# Patient Record
Sex: Male | Born: 1965
Health system: Southern US, Community
[De-identification: ages and names within clinical notes are randomized; demographics above are authoritative.]

## PROBLEM LIST (undated history)

## (undated) DIAGNOSIS — M75100 Unspecified rotator cuff tear or rupture of unspecified shoulder, not specified as traumatic: Secondary | ICD-10-CM

## (undated) DIAGNOSIS — F909 Attention-deficit hyperactivity disorder, unspecified type: Secondary | ICD-10-CM

## (undated) DIAGNOSIS — I10 Essential (primary) hypertension: Secondary | ICD-10-CM

## (undated) DIAGNOSIS — Z8249 Family history of ischemic heart disease and other diseases of the circulatory system: Secondary | ICD-10-CM

## (undated) DIAGNOSIS — E785 Hyperlipidemia, unspecified: Secondary | ICD-10-CM

## (undated) DIAGNOSIS — E8881 Metabolic syndrome: Secondary | ICD-10-CM

## (undated) DIAGNOSIS — I861 Scrotal varices: Secondary | ICD-10-CM

## (undated) HISTORY — DX: Hyperlipidemia, unspecified: E78.5

## (undated) HISTORY — DX: Scrotal varices: I86.1

## (undated) HISTORY — PX: APPENDECTOMY: SHX54

## (undated) HISTORY — PX: ORIF ANKLE FRACTURE: SUR919

## (undated) HISTORY — DX: Metabolic syndrome: E88.81

## (undated) HISTORY — DX: Unspecified rotator cuff tear or rupture of unspecified shoulder, not specified as traumatic: M75.100

## (undated) HISTORY — DX: Family history of ischemic heart disease and other diseases of the circulatory system: Z82.49

## (undated) HISTORY — PX: COLONOSCOPY: SHX174

## (undated) HISTORY — DX: Metabolic syndrome: E88.810

## (undated) HISTORY — DX: Attention-deficit hyperactivity disorder, unspecified type: F90.9

---

## 2004-07-13 ENCOUNTER — Ambulatory Visit (HOSPITAL_COMMUNITY): Admission: RE | Admit: 2004-07-13 | Discharge: 2004-07-13 | Payer: Self-pay | Admitting: *Deleted

## 2006-11-02 ENCOUNTER — Ambulatory Visit (HOSPITAL_COMMUNITY): Admission: RE | Admit: 2006-11-02 | Discharge: 2006-11-02 | Payer: Self-pay | Admitting: Urology

## 2014-06-06 ENCOUNTER — Encounter: Payer: Self-pay | Admitting: *Deleted

## 2014-06-07 ENCOUNTER — Encounter: Payer: Self-pay | Admitting: *Deleted

## 2016-11-24 DIAGNOSIS — M25512 Pain in left shoulder: Secondary | ICD-10-CM | POA: Diagnosis not present

## 2016-11-24 DIAGNOSIS — M25511 Pain in right shoulder: Secondary | ICD-10-CM | POA: Diagnosis not present

## 2016-11-24 DIAGNOSIS — M25559 Pain in unspecified hip: Secondary | ICD-10-CM | POA: Diagnosis not present

## 2016-12-13 DIAGNOSIS — M25512 Pain in left shoulder: Secondary | ICD-10-CM | POA: Diagnosis not present

## 2016-12-13 DIAGNOSIS — M25559 Pain in unspecified hip: Secondary | ICD-10-CM | POA: Diagnosis not present

## 2016-12-13 DIAGNOSIS — M25511 Pain in right shoulder: Secondary | ICD-10-CM | POA: Diagnosis not present

## 2017-02-14 DIAGNOSIS — Z23 Encounter for immunization: Secondary | ICD-10-CM | POA: Diagnosis not present

## 2017-02-14 DIAGNOSIS — E782 Mixed hyperlipidemia: Secondary | ICD-10-CM | POA: Diagnosis not present

## 2017-02-14 DIAGNOSIS — Z79899 Other long term (current) drug therapy: Secondary | ICD-10-CM | POA: Diagnosis not present

## 2017-02-14 DIAGNOSIS — G479 Sleep disorder, unspecified: Secondary | ICD-10-CM | POA: Diagnosis not present

## 2017-02-14 DIAGNOSIS — Z0000 Encounter for general adult medical examination without abnormal findings: Secondary | ICD-10-CM | POA: Diagnosis not present

## 2017-02-14 DIAGNOSIS — I10 Essential (primary) hypertension: Secondary | ICD-10-CM | POA: Diagnosis not present

## 2017-03-07 DIAGNOSIS — C44619 Basal cell carcinoma of skin of left upper limb, including shoulder: Secondary | ICD-10-CM | POA: Diagnosis not present

## 2017-03-07 DIAGNOSIS — L42 Pityriasis rosea: Secondary | ICD-10-CM | POA: Diagnosis not present

## 2017-03-07 DIAGNOSIS — D225 Melanocytic nevi of trunk: Secondary | ICD-10-CM | POA: Diagnosis not present

## 2017-03-07 DIAGNOSIS — C44519 Basal cell carcinoma of skin of other part of trunk: Secondary | ICD-10-CM | POA: Diagnosis not present

## 2017-03-07 DIAGNOSIS — L821 Other seborrheic keratosis: Secondary | ICD-10-CM | POA: Diagnosis not present

## 2017-04-25 DIAGNOSIS — K4090 Unilateral inguinal hernia, without obstruction or gangrene, not specified as recurrent: Secondary | ICD-10-CM | POA: Diagnosis not present

## 2017-06-06 DIAGNOSIS — K4090 Unilateral inguinal hernia, without obstruction or gangrene, not specified as recurrent: Secondary | ICD-10-CM | POA: Diagnosis not present

## 2017-08-18 DIAGNOSIS — I10 Essential (primary) hypertension: Secondary | ICD-10-CM | POA: Diagnosis not present

## 2017-08-18 DIAGNOSIS — K4090 Unilateral inguinal hernia, without obstruction or gangrene, not specified as recurrent: Secondary | ICD-10-CM | POA: Diagnosis not present

## 2017-09-02 DIAGNOSIS — J069 Acute upper respiratory infection, unspecified: Secondary | ICD-10-CM | POA: Diagnosis not present

## 2017-09-05 DIAGNOSIS — R05 Cough: Secondary | ICD-10-CM | POA: Diagnosis not present

## 2017-09-05 DIAGNOSIS — J0100 Acute maxillary sinusitis, unspecified: Secondary | ICD-10-CM | POA: Diagnosis not present

## 2017-12-18 DIAGNOSIS — M542 Cervicalgia: Secondary | ICD-10-CM | POA: Diagnosis not present

## 2017-12-18 DIAGNOSIS — M6281 Muscle weakness (generalized): Secondary | ICD-10-CM | POA: Diagnosis not present

## 2017-12-18 DIAGNOSIS — M79602 Pain in left arm: Secondary | ICD-10-CM | POA: Diagnosis not present

## 2018-01-03 DIAGNOSIS — M79602 Pain in left arm: Secondary | ICD-10-CM | POA: Diagnosis not present

## 2018-01-03 DIAGNOSIS — M542 Cervicalgia: Secondary | ICD-10-CM | POA: Diagnosis not present

## 2018-01-03 DIAGNOSIS — M6281 Muscle weakness (generalized): Secondary | ICD-10-CM | POA: Diagnosis not present

## 2018-11-12 ENCOUNTER — Ambulatory Visit (INDEPENDENT_AMBULATORY_CARE_PROVIDER_SITE_OTHER): Payer: 59 | Admitting: Family Medicine

## 2018-11-12 ENCOUNTER — Encounter (INDEPENDENT_AMBULATORY_CARE_PROVIDER_SITE_OTHER): Payer: Self-pay | Admitting: Family Medicine

## 2018-11-12 ENCOUNTER — Ambulatory Visit (INDEPENDENT_AMBULATORY_CARE_PROVIDER_SITE_OTHER): Payer: 59

## 2018-11-12 VITALS — BP 132/81 | HR 72 | Ht 72.0 in | Wt 185.0 lb

## 2018-11-12 DIAGNOSIS — M25552 Pain in left hip: Secondary | ICD-10-CM | POA: Diagnosis not present

## 2018-11-12 NOTE — Progress Notes (Signed)
   Office Visit Note   Patient: Fernando Hernandez           Date of Birth: [birth year]           MRN: [id number] Visit Date: 11/12/2018 Requested by: No referring provider defined for this encounter. PCP: Josetta Huddle, MD  Subjective: Chief Complaint  Patient presents with  . Left Hip - Pain  . Hip Pain    Lt hip---sharp, constant sore--2 months--Pt went to Physical therapy for 1 month.    HPI: He is a 53 year old seen at the request of [place] banker for left hip pain.  Symptoms started a couple months ago, no injury.  He is active exercising at the gym and jogging.  There has been no significant change in his routine.  He started noticing intermittent groin pain with occasional activities.  He cannot completely reproduce the pain reliably.  Occasional popping.  He recalls when he was younger a "groin pull" injury from which he recovered without any long-term troubles.  He has not taken medication for this pain because it is not constant.  He is working with his physical therapist but not making much progress yet.               ROS: Otherwise in excellent health.  Other systems were negative.  Objective: Vital Signs: BP 132/81   Pulse 72   Ht 6' (1.829 m)   Wt 185 lb (83.9 kg)   BMI 25.09 kg/m   Physical Exam:  Left hip: No tenderness over the greater trochanter.  He has very limited range of motion of both hips with internal rotation.  He has pain on the left with passive flexion and internal rotation and also at the extreme of external rotation.  No significant pain with isometric strength testing.  Leg lengths are equal.  Imaging: X-rays left hip: He has slight flattening of the femoral head bilaterally.  More so on the left.  There is calcification at the lateral aspect of the acetabulum suggesting chronic labrum tear.  The acetabulum itself is unremarkable, there is still AC joint space.  Assessment & Plan: 1.  Left hip pain, suspicious for AVN.  He probably has it on the right as  well. -MRI to evaluate.  Probably modify activities depending on the findings.   Follow-Up Instructions: No follow-ups on file.      Procedures: No procedures performed  No notes on file    PMFS History: There are no active problems to display for this patient.  Past Medical History:  Diagnosis Date  . Attention deficit hyperactivity disorder (ADHD)   . Family history of early CAD   . Hyperlipidemia   . Left varicocele   . Metabolic syndrome   . Rotator cuff syndrome     Family History  Family history unknown: Yes    History reviewed. No pertinent surgical history. Social History   Occupational History  . Not on file  Tobacco Use  . Smoking status: Never Smoker  . Smokeless tobacco: Never Used  Substance and Sexual Activity  . Alcohol use: Yes    Comment: occasion  . Drug use: No  . Sexual activity: Not on file

## 2018-11-12 NOTE — Patient Instructions (Signed)
   Possible avascular necrosis of femoral head (AVN)

## 2018-11-18 ENCOUNTER — Ambulatory Visit
Admission: RE | Admit: 2018-11-18 | Discharge: 2018-11-18 | Disposition: A | Discharge status: Home / Self Care | Payer: 59 | Source: Ambulatory Visit | Attending: Family Medicine | Admitting: Family Medicine

## 2018-11-18 DIAGNOSIS — M25552 Pain in left hip: Secondary | ICD-10-CM

## 2018-11-19 ENCOUNTER — Telehealth (INDEPENDENT_AMBULATORY_CARE_PROVIDER_SITE_OTHER): Payer: Self-pay | Admitting: Family Medicine

## 2018-11-19 ENCOUNTER — Telehealth (INDEPENDENT_AMBULATORY_CARE_PROVIDER_SITE_OTHER): Payer: Self-pay

## 2018-11-19 NOTE — Telephone Encounter (Signed)
I called and advised the patient of the results and plan. He voiced understanding.

## 2018-11-19 NOTE — Telephone Encounter (Signed)
Pelvis MRI scan shows a substantial amount of arthritis in both hips, but fortunately no sign of avascular necrosis.  He will modify activities to prolong life of hips.

## 2018-11-19 NOTE — Telephone Encounter (Signed)
      Fernando Hernandez Male, 53 y.o., 11/30/1965 MRN:  [id number] Phone:  [phone] (H) Needs Interpreter: Cleophus Molt PCP:  Josetta Huddle, MD Primary [facility]vg:  [facility] OTHER MRI results  Received: Today  Message Contents  [place], Legrand Como, MD  Marlyne Beards, CMA        I called, but got voice mail. Email address didn't work.   Please tell him:   Pelvis MRI scan shows a substantial amount of arthritis in both hips, but fortunately no sign of avascular necrosis. He should modify exercise activities to prolong life of hips. Would recommend switching from running to something involving less pounding such as bike, elliptical trainer, swimming, etc. I would also avoid doing heavy squats and lunges at the gym. If pain gets worse, could inject with cortisone. Otherwise follow-up as needed. No surgery indicated for now.

## 2018-12-31 ENCOUNTER — Telehealth (INDEPENDENT_AMBULATORY_CARE_PROVIDER_SITE_OTHER): Payer: Self-pay | Admitting: Family Medicine

## 2018-12-31 NOTE — Telephone Encounter (Signed)
Patient called wanting to be sure his ov note from his last ov was sent to his PCP, Dr. Mertha Finders. I confirmed with patient it was sent

## 2019-01-03 ENCOUNTER — Ambulatory Visit
Admission: RE | Admit: 2019-01-03 | Discharge: 2019-01-03 | Disposition: A | Discharge status: Home / Self Care | Payer: 59 | Source: Ambulatory Visit | Attending: Internal Medicine | Admitting: Internal Medicine

## 2019-01-03 ENCOUNTER — Other Ambulatory Visit: Payer: Self-pay

## 2019-01-03 ENCOUNTER — Other Ambulatory Visit: Payer: Self-pay | Admitting: Internal Medicine

## 2019-01-03 DIAGNOSIS — M545 Low back pain, unspecified: Secondary | ICD-10-CM

## 2019-01-14 ENCOUNTER — Other Ambulatory Visit: Payer: Self-pay | Admitting: Internal Medicine

## 2019-01-14 DIAGNOSIS — M5416 Radiculopathy, lumbar region: Secondary | ICD-10-CM

## 2019-01-14 DIAGNOSIS — M545 Low back pain, unspecified: Secondary | ICD-10-CM

## 2019-01-17 ENCOUNTER — Other Ambulatory Visit: Payer: 59

## 2019-02-18 ENCOUNTER — Ambulatory Visit
Admission: RE | Admit: 2019-02-18 | Discharge: 2019-02-18 | Disposition: A | Discharge status: Home / Self Care | Payer: 59 | Source: Ambulatory Visit | Attending: Internal Medicine | Admitting: Internal Medicine

## 2019-02-18 ENCOUNTER — Other Ambulatory Visit: Payer: Self-pay

## 2019-02-18 DIAGNOSIS — M545 Low back pain, unspecified: Secondary | ICD-10-CM

## 2019-02-18 DIAGNOSIS — M5416 Radiculopathy, lumbar region: Secondary | ICD-10-CM

## 2019-07-23 ENCOUNTER — Other Ambulatory Visit: Payer: Self-pay

## 2019-07-23 DIAGNOSIS — Z20822 Contact with and (suspected) exposure to covid-19: Secondary | ICD-10-CM

## 2019-07-24 LAB — NOVEL CORONAVIRUS, NAA: SARS-CoV-2, NAA: NOT DETECTED

## 2019-07-27 IMAGING — CR LUMBAR SPINE - 2-3 VIEW
3 series · 3 of 3 positions shown · non-contrast
Comparison: None.

CLINICAL DATA: 52-year-old male with a history of left groin and
buttock pain for 6 months

EXAM:
LUMBAR SPINE - 2-3 VIEW

[w lumbar spine ap]
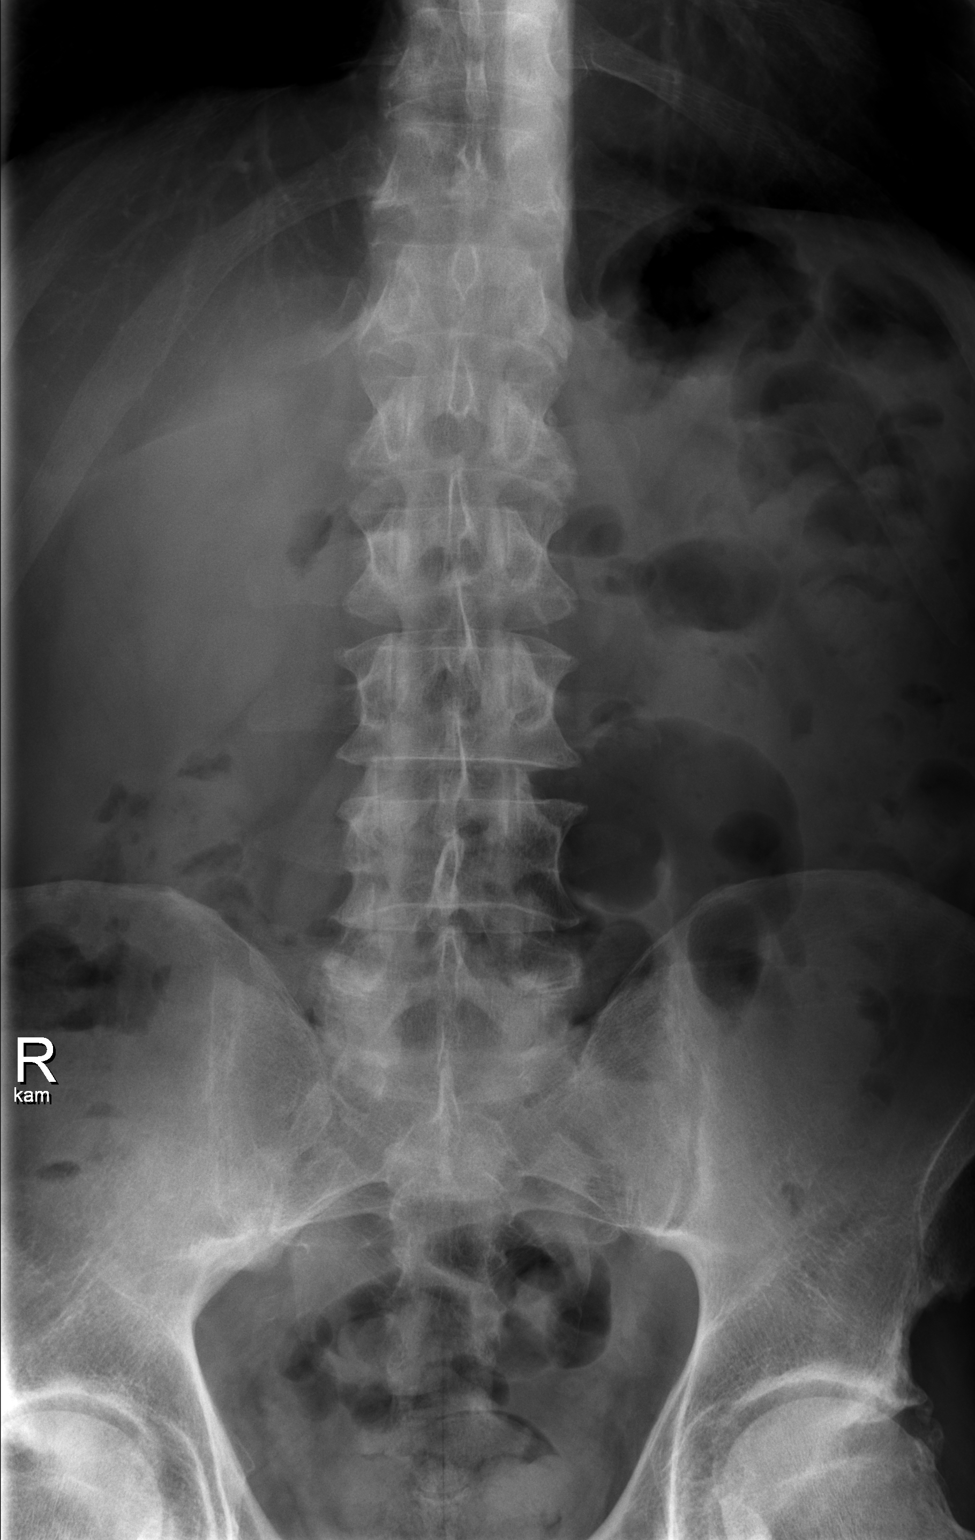

[w lumbar spine lat]
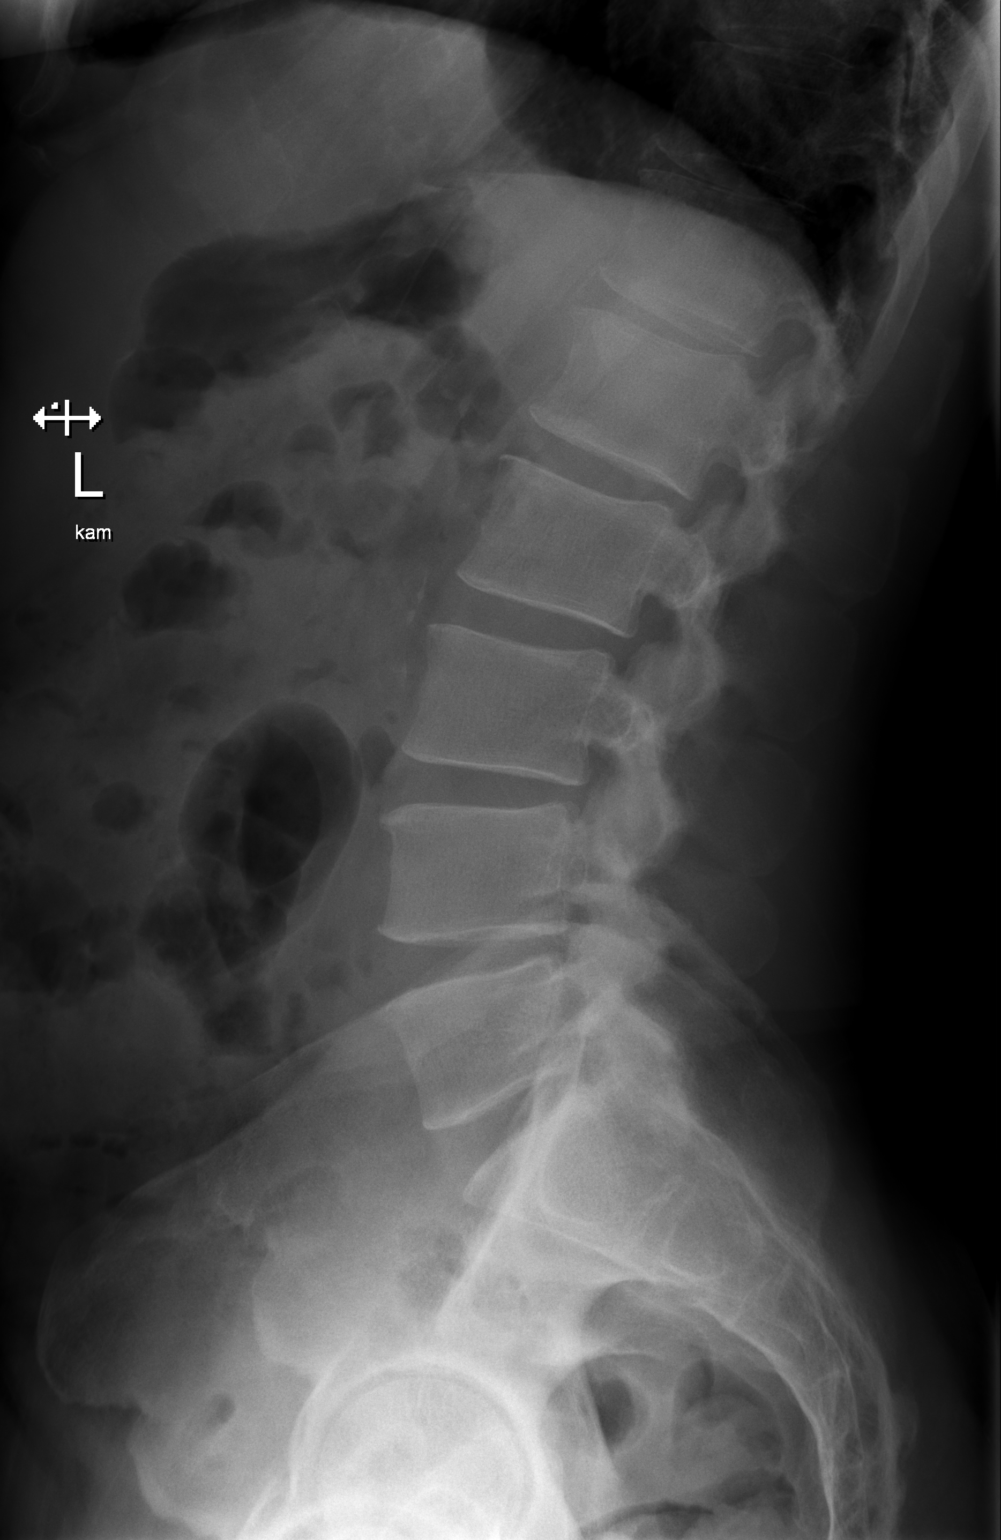

[w lumbar l-5 s-1 spot]
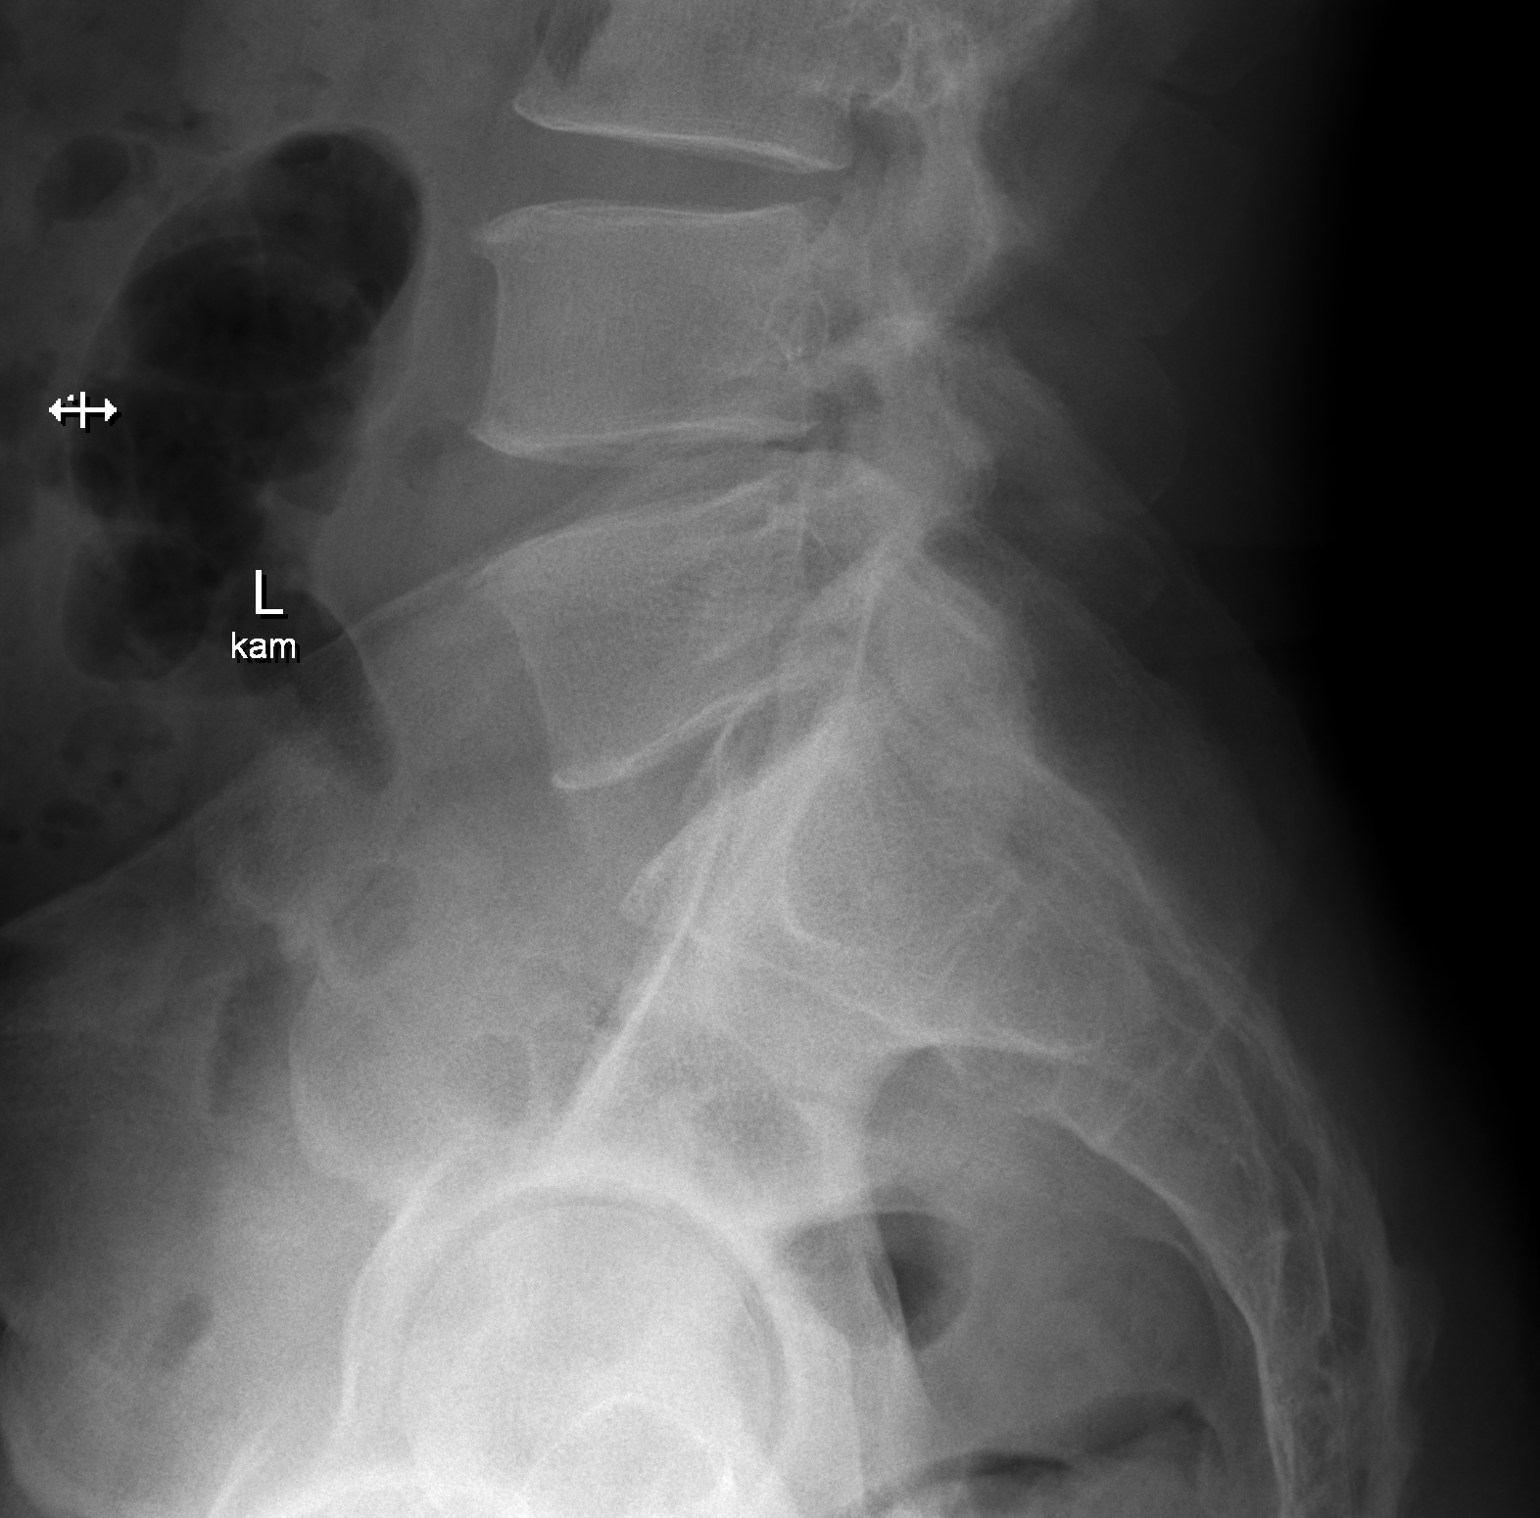

[3 of 3 positions shown; findings below may reference images not displayed]

FINDINGS: Lumbar Spine:

Lumbar vertebral elements maintain normal alignment without evidence
of anterolisthesis, retrolisthesis, subluxation.

No acute fracture line identified.

Vertebral body heights maintained.

Relatively maintained disc spaces with early endplate changes and
early anterior osteophyte production at the L3-L4 and L4-L5 levels.

Mild facet disease of L3-L4, L4-L5, L5-S1.

Unremarkable appearance of the visualized abdomen.
IMPRESSION: Negative for acute fracture or malalignment of the lumbar spine.

Mild disc disease and facet disease.

## 2019-08-01 ENCOUNTER — Other Ambulatory Visit: Payer: Self-pay

## 2019-08-01 DIAGNOSIS — Z20822 Contact with and (suspected) exposure to covid-19: Secondary | ICD-10-CM

## 2019-08-02 LAB — NOVEL CORONAVIRUS, NAA: SARS-CoV-2, NAA: NOT DETECTED

## 2019-09-24 ENCOUNTER — Ambulatory Visit: Payer: 59 | Attending: Internal Medicine

## 2019-09-24 DIAGNOSIS — Z20822 Contact with and (suspected) exposure to covid-19: Secondary | ICD-10-CM

## 2019-09-25 LAB — NOVEL CORONAVIRUS, NAA: SARS-CoV-2, NAA: NOT DETECTED

## 2019-11-13 ENCOUNTER — Other Ambulatory Visit: Payer: 59

## 2019-11-13 ENCOUNTER — Ambulatory Visit: Payer: 59 | Attending: Internal Medicine

## 2019-11-13 DIAGNOSIS — Z20822 Contact with and (suspected) exposure to covid-19: Secondary | ICD-10-CM

## 2019-11-14 LAB — NOVEL CORONAVIRUS, NAA: SARS-CoV-2, NAA: NOT DETECTED

## 2020-01-13 ENCOUNTER — Other Ambulatory Visit: Payer: 59

## 2020-01-15 ENCOUNTER — Ambulatory Visit: Payer: 59 | Attending: Internal Medicine

## 2020-01-15 DIAGNOSIS — Z20822 Contact with and (suspected) exposure to covid-19: Secondary | ICD-10-CM

## 2020-01-16 LAB — SARS-COV-2, NAA 2 DAY TAT

## 2020-01-16 LAB — NOVEL CORONAVIRUS, NAA: SARS-CoV-2, NAA: NOT DETECTED

## 2021-02-09 DIAGNOSIS — F39 Unspecified mood [affective] disorder: Secondary | ICD-10-CM | POA: Diagnosis not present

## 2021-02-09 DIAGNOSIS — R5383 Other fatigue: Secondary | ICD-10-CM | POA: Diagnosis not present

## 2021-06-21 DIAGNOSIS — Z79899 Other long term (current) drug therapy: Secondary | ICD-10-CM | POA: Diagnosis not present

## 2021-06-21 DIAGNOSIS — Z0001 Encounter for general adult medical examination with abnormal findings: Secondary | ICD-10-CM | POA: Diagnosis not present

## 2021-06-21 DIAGNOSIS — E782 Mixed hyperlipidemia: Secondary | ICD-10-CM | POA: Diagnosis not present

## 2021-06-21 DIAGNOSIS — Z125 Encounter for screening for malignant neoplasm of prostate: Secondary | ICD-10-CM | POA: Diagnosis not present

## 2021-06-21 DIAGNOSIS — I10 Essential (primary) hypertension: Secondary | ICD-10-CM | POA: Diagnosis not present

## 2021-06-21 DIAGNOSIS — G479 Sleep disorder, unspecified: Secondary | ICD-10-CM | POA: Diagnosis not present

## 2021-06-21 DIAGNOSIS — F909 Attention-deficit hyperactivity disorder, unspecified type: Secondary | ICD-10-CM | POA: Diagnosis not present

## 2021-07-21 DIAGNOSIS — I10 Essential (primary) hypertension: Secondary | ICD-10-CM | POA: Diagnosis not present

## 2021-08-16 DIAGNOSIS — H0288A Meibomian gland dysfunction right eye, upper and lower eyelids: Secondary | ICD-10-CM | POA: Diagnosis not present

## 2021-08-16 DIAGNOSIS — H0288B Meibomian gland dysfunction left eye, upper and lower eyelids: Secondary | ICD-10-CM | POA: Diagnosis not present

## 2021-08-16 DIAGNOSIS — H16223 Keratoconjunctivitis sicca, not specified as Sjogren's, bilateral: Secondary | ICD-10-CM | POA: Diagnosis not present

## 2022-05-19 ENCOUNTER — Other Ambulatory Visit: Payer: Self-pay | Admitting: Internal Medicine

## 2022-05-19 DIAGNOSIS — I10 Essential (primary) hypertension: Secondary | ICD-10-CM

## 2022-05-19 DIAGNOSIS — E782 Mixed hyperlipidemia: Secondary | ICD-10-CM

## 2022-05-19 DIAGNOSIS — Z8249 Family history of ischemic heart disease and other diseases of the circulatory system: Secondary | ICD-10-CM

## 2022-07-13 ENCOUNTER — Other Ambulatory Visit: Payer: Self-pay | Admitting: Internal Medicine

## 2022-07-13 DIAGNOSIS — S8292XB Unspecified fracture of left lower leg, initial encounter for open fracture type I or II: Secondary | ICD-10-CM

## 2022-07-13 DIAGNOSIS — S82892B Other fracture of left lower leg, initial encounter for open fracture type I or II: Secondary | ICD-10-CM

## 2022-07-13 DIAGNOSIS — T07XXXA Unspecified multiple injuries, initial encounter: Secondary | ICD-10-CM

## 2022-07-29 ENCOUNTER — Other Ambulatory Visit: Payer: 59

## 2022-08-10 ENCOUNTER — Ambulatory Visit
Admission: RE | Admit: 2022-08-10 | Discharge: 2022-08-10 | Disposition: A | Discharge status: Home / Self Care | Payer: No Typology Code available for payment source | Source: Ambulatory Visit | Attending: Internal Medicine | Admitting: Internal Medicine

## 2022-08-10 DIAGNOSIS — Z8249 Family history of ischemic heart disease and other diseases of the circulatory system: Secondary | ICD-10-CM

## 2022-08-10 DIAGNOSIS — E782 Mixed hyperlipidemia: Secondary | ICD-10-CM

## 2022-08-10 DIAGNOSIS — I10 Essential (primary) hypertension: Secondary | ICD-10-CM

## 2022-08-16 ENCOUNTER — Ambulatory Visit
Admission: RE | Admit: 2022-08-16 | Discharge: 2022-08-16 | Disposition: A | Discharge status: Home / Self Care | Payer: No Typology Code available for payment source | Source: Ambulatory Visit | Attending: Internal Medicine | Admitting: Internal Medicine

## 2022-08-16 DIAGNOSIS — S8292XB Unspecified fracture of left lower leg, initial encounter for open fracture type I or II: Secondary | ICD-10-CM

## 2022-08-16 DIAGNOSIS — S82892B Other fracture of left lower leg, initial encounter for open fracture type I or II: Secondary | ICD-10-CM

## 2022-08-16 DIAGNOSIS — T07XXXA Unspecified multiple injuries, initial encounter: Secondary | ICD-10-CM

## 2023-12-19 ENCOUNTER — Ambulatory Visit (INDEPENDENT_AMBULATORY_CARE_PROVIDER_SITE_OTHER): Admitting: Internal Medicine

## 2023-12-19 ENCOUNTER — Encounter (HOSPITAL_BASED_OUTPATIENT_CLINIC_OR_DEPARTMENT_OTHER): Payer: Self-pay | Admitting: Internal Medicine

## 2023-12-19 VITALS — BP 124/62 | HR 83 | Ht 72.0 in | Wt 186.5 lb

## 2023-12-19 DIAGNOSIS — E7841 Elevated Lipoprotein(a): Secondary | ICD-10-CM | POA: Diagnosis not present

## 2023-12-19 DIAGNOSIS — I2510 Atherosclerotic heart disease of native coronary artery without angina pectoris: Secondary | ICD-10-CM | POA: Diagnosis not present

## 2023-12-19 DIAGNOSIS — E785 Hyperlipidemia, unspecified: Secondary | ICD-10-CM | POA: Diagnosis not present

## 2023-12-19 NOTE — Patient Instructions (Signed)
 Medication Instructions:  Dr. Rennis Golden recommends Repatha Sureclick 140mg /mL (PCSK9). This is an injectable cholesterol medication self-administered once every 14 days. This medication will likely need prior approval with your insurance company, which we will work on. If the medication is not approved initially, we may need to do an appeal with your insurance.   Administer medication in area of fatty tissue such as abdomen, outer thigh, back of upper arm - and rotate site with each injection Store medication in refrigerator until ready to administer - allow to sit at room temp for 30 mins - 1 hour prior to injection Dispose of medication in a SHARPS container - your pharmacy should be able to direct you on this and proper disposal   If you need a co-pay card for Repatha: Lawsponsor.fr If you need a co-pay card for Praluent: [email or web address]://[email or web address]  Patient Assistance:    These foundations have funds at various times.   The PAN Foundation: https://www.panfoundation.org/disease-funds/hypercholesterolemia/ -- can sign up for wait list  The [facility] offers assistance to help pay for medication copays.  They will cover copays for all cholesterol lowering meds, including statins, fibrates, omega-3 fish oils like Vascepa, ezetimibe, Repatha, Praluent, Nexletol, Nexlizet.  The cards are usually good for $2,500 or 12 months, whichever comes first. Our fax # is [phone] (you will need this to apply) Go to healthwellfoundation.org Click on "Apply Now" Answer questions as to whom is applying (patient or representative) Your disease fund will be "hypercholesterolemia - Medicare access" They will ask questions about finances and which medications you are taking for cholesterol When you submit, the approval is usually within minutes.  You will need to print the card information from the site You will need to show this information to your pharmacy,  they will bill your Medicare Part D plan first -then bill Health Well --for the copay.   You can also call them at [phone], although the hold times can be quite long.    *If you need a refill on your cardiac medications before your next appointment, please call your pharmacy*   Lab Work: FASTING lab work in 3-4 months  ** complete about one week before next appointment   If you have labs (blood work) drawn today and your tests are completely normal, you will receive your results only by: MyChart Message (if you have MyChart) OR A paper copy in the mail If you have any lab test that is abnormal or we need to change your treatment, we will call you to review the results.   Follow-Up: At [facility], you and your health needs are our priority.  As part of our continuing mission to provide you with exceptional heart care, we have created designated Provider Care Teams.  These Care Teams include your primary Cardiologist (physician) and Advanced Practice Providers (APPs -  Physician Assistants and Nurse Practitioners) who all work together to provide you with the care you need, when you need it.  We recommend signing up for the patient portal called "MyChart".  Sign up information is provided on this After Visit Summary.  MyChart is used to connect with patients for Virtual Visits (Telemedicine).  Patients are able to view lab/test results, encounter notes, upcoming appointments, etc.  Non-urgent messages can be sent to your provider as well.   To learn more about what you can do with MyChart, go to [email or web address].    Your next appointment:   3-4 months with Eligha Bridegroom, NP -- lipid clinic

## 2023-12-19 NOTE — Progress Notes (Signed)
 LIPID CLINIC CONSULT NOTE  Chief Complaint:  Manage dyslipidemia  Primary Care Physician: Marden Noble, MD (Inactive)  Primary Cardiologist:  None  HPI:  Fernando Hernandez is a 58 y.o. male who is being seen today for the evaluation of dyslipidemia at the request of Emilio Aspen, *.  This is a pleasant 58 year old male kindly referred for evaluation management of dyslipidemia.  He has a history of coronary artery calcification seen by a calcium score in November 2023 of 91.8, 80th percentile for age and sex matched controls.  He also had assessment of LP(a) which is mildly elevated at 109.8 nmol/L.  His recent lipid profile showed total cholesterol 188, triglycerides 96, HDL 83 and LDL 88.  He is on rosuvastatin 40 mg daily.  He had a remote check of high-sensitivity CRP which was 2.0 in 2022.  The circumstances of that are not clear.  His father had an MI in his 31s and ultimately had CABG.  His goal LDL cholesterol should be below 70 if not lower.  To that end we discussed possible additional therapies today.  He says he is vigilant about his diet as far as reducing saturated fats and getting regular exercise.  PMHx:  Past Medical History:  Diagnosis Date   Attention deficit hyperactivity disorder (ADHD)    Family history of early CAD    Hyperlipidemia    Left varicocele    Metabolic syndrome    Rotator cuff syndrome     No past surgical history on file.  FAMHx:  Family History  Problem Relation Age of Onset   Heart attack Father    Coronary artery disease Father     SOCHx:   reports that he has never smoked. He has never used smokeless tobacco. He reports current alcohol use. He reports that he does not use drugs.  ALLERGIES:  No Known Allergies  ROS: Pertinent items noted in HPI and remainder of comprehensive ROS otherwise negative.  HOME MEDS: Current Outpatient Medications on File Prior to Visit  Medication Sig Dispense Refill   amLODipine  (NORVASC) 10 MG tablet Take 10 mg by mouth daily.     amphetamine-dextroamphetamine (ADDERALL) 15 MG tablet Take 15 mg by mouth daily.     aspirin 81 MG tablet Take 81 mg by mouth daily.     hydrochlorothiazide (HYDRODIURIL) 12.5 MG tablet Take 12.5 mg by mouth daily.     losartan (COZAAR) 100 MG tablet Take 100 mg by mouth daily. Takes 1 tab in the morning, 1/2 tab at night.     metoprolol succinate (TOPROL-XL) 25 MG 24 hr tablet Take 25 mg by mouth daily.     rosuvastatin (CRESTOR) 40 MG tablet Take 40 mg by mouth daily.     zolpidem (AMBIEN) 10 MG tablet Take 10 mg by mouth at bedtime as needed for sleep.     losartan-hydrochlorothiazide (HYZAAR) 50-12.5 MG tablet  (Patient not taking: Reported on 12/19/2023)     No current facility-administered medications on file prior to visit.    LABS/IMAGING: No results found for this or any previous visit (from the past 48 hours). No results found.  LIPID PANEL: No results found for: "CHOL", "TRIG", "HDL", "CHOLHDL", "VLDL", "LDLCALC", "LDLDIRECT"  WEIGHTS: Wt Readings from Last 3 Encounters:  12/19/23 186 lb 8 oz (84.6 kg)  11/12/18 185 lb (83.9 kg)    VITALS: BP 124/62   Pulse 83   Ht 6' (1.829 m)   Wt 186 lb 8 oz (84.6  kg)   SpO2 95%   BMI 25.29 kg/m   EXAM: Deferred  EKG: Deferred  ASSESSMENT: Dyslipidemia, goal LDL less than 70 Elevated AO(Z)-308.6 nmol/L CAC score 91.8, 90th percentile for age and sex matched controls (08/2022) History of HS-CRP of 2.0 (2022) Family history of premature coronary disease with father who had an MI and CABG  PLAN: 1.   Mr. Levora Angel has a mixed dyslipidemia with cholesterol that is above target.  He also has an elevated LP(a) and a high calcium score.  He needs more aggressive lipid-lowering and would probably benefit from a PCSK9 inhibitor which could help him reach his LDL target as well as improve LP(a) for most patients up to 20 to 30%.  Based on that I would not pursue PCSK9 inhibitor  therapy.  Will reach out for prior authorization.  Plan repeat lipids in about 3 to 4 months including a repeat LP(a) and follow-up afterwards.  Chrystie Nose, MD, [facility], FACP    [facility]  Medical Director of the Advanced Lipid Disorders &  Cardiovascular Risk Reduction Clinic Diplomate of the American Board of Clinical Lipidology Attending Cardiologist  Direct Dial: [phone]  Fax: [phone]  Website:  [email or web address] 12/19/2023, 5:10 PM

## 2023-12-21 ENCOUNTER — Other Ambulatory Visit (HOSPITAL_COMMUNITY): Payer: Self-pay

## 2023-12-21 ENCOUNTER — Telehealth: Payer: Self-pay | Admitting: Pharmacy Technician

## 2023-12-21 NOTE — Telephone Encounter (Signed)
 Ran test claim for repatha. For a 28 day supply and the co-pay is 40.00 . PA is not needed at this time. This test claim was processed through [facility]- copay amounts may vary at other pharmacies due to pharmacy/plan contracts, or as the patient moves through the different stages of their insurance plan.

## 2023-12-25 MED ORDER — REPATHA SURECLICK 140 MG/ML ~~LOC~~ SOAJ: 140.0000 mg | SUBCUTANEOUS | 3 refills | Status: DC

## 2023-12-25 NOTE — Addendum Note (Signed)
 Addended by: Lindell Spar on: 12/25/2023 03:02 PM   Modules accepted: Orders

## 2023-12-25 NOTE — Telephone Encounter (Signed)
 Update sent to patient via MyChart

## 2023-12-25 NOTE — Telephone Encounter (Signed)
 Rx(s) sent to pharmacy electronically.

## 2024-01-13 ENCOUNTER — Other Ambulatory Visit: Payer: Self-pay | Admitting: Medical Genetics

## 2024-01-24 ENCOUNTER — Other Ambulatory Visit

## 2024-01-24 DIAGNOSIS — Z006 Encounter for examination for normal comparison and control in clinical research program: Secondary | ICD-10-CM

## 2024-01-30 LAB — GENECONNECT MOLECULAR SCREEN: Genetic Analysis Overall Interpretation: NEGATIVE

## 2024-03-01 ENCOUNTER — Other Ambulatory Visit (HOSPITAL_COMMUNITY): Payer: Self-pay | Admitting: Internal Medicine

## 2024-03-06 ENCOUNTER — Ambulatory Visit (HOSPITAL_BASED_OUTPATIENT_CLINIC_OR_DEPARTMENT_OTHER): Admitting: Nurse Practitioner

## 2024-03-06 ENCOUNTER — Encounter (HOSPITAL_BASED_OUTPATIENT_CLINIC_OR_DEPARTMENT_OTHER): Payer: Self-pay | Admitting: Nurse Practitioner

## 2024-03-06 VITALS — BP 122/82 | HR 67 | Ht 72.0 in | Wt 185.0 lb

## 2024-03-06 DIAGNOSIS — E7841 Elevated Lipoprotein(a): Secondary | ICD-10-CM

## 2024-03-06 DIAGNOSIS — E785 Hyperlipidemia, unspecified: Secondary | ICD-10-CM | POA: Diagnosis not present

## 2024-03-06 DIAGNOSIS — R9431 Abnormal electrocardiogram [ECG] [EKG]: Secondary | ICD-10-CM

## 2024-03-06 DIAGNOSIS — I2510 Atherosclerotic heart disease of native coronary artery without angina pectoris: Secondary | ICD-10-CM | POA: Diagnosis not present

## 2024-03-06 DIAGNOSIS — I10 Essential (primary) hypertension: Secondary | ICD-10-CM

## 2024-03-06 MED ORDER — REPATHA SURECLICK 140 MG/ML ~~LOC~~ SOAJ: 140.0000 mg | SUBCUTANEOUS | 3 refills | Status: AC

## 2024-03-06 NOTE — Patient Instructions (Signed)
 Medication Instructions:   Your physician recommends that you continue on your current medications as directed. Please refer to the Current Medication list given to you today.   *If you need a refill on your cardiac medications before your next appointment, please call your pharmacy*  Lab Work:  None ordered.  If you have labs (blood work) drawn today and your tests are completely normal, you will receive your results only by: MyChart Message (if you have MyChart) OR A paper copy in the mail If you have any lab test that is abnormal or we need to change your treatment, we will call you to review the results.  Testing/Procedures:  Your physician has requested that you have an echocardiogram. Echocardiography is a painless test that uses sound waves to create images of your heart. It provides your doctor with information about the size and shape of your heart and how well your heart's chambers and valves are working. This procedure takes approximately one hour. There are no restrictions for this procedure. Please do NOT wear cologne, aftershave, or lotions (deodorant is allowed). Please arrive 15 minutes prior to your appointment time.  Follow-Up: At [facility], you and your health needs are our priority.  As part of our continuing mission to provide you with exceptional heart care, our providers are all part of one team.  This team includes your primary Cardiologist (physician) and Advanced Practice Providers or APPs (Physician Assistants and Nurse Practitioners) who all work together to provide you with the care you need, when you need it.  Your next appointment:   1 year(s)  Provider:   K. Italy Hilty, MD or Slater Duncan, NP    We recommend signing up for the patient portal called "MyChart".  Sign up information is provided on this After Visit Summary.  MyChart is used to connect with patients for Virtual Visits (Telemedicine).  Patients are able to view lab/test  results, encounter notes, upcoming appointments, etc.  Non-urgent messages can be sent to your provider as well.   To learn more about what you can do with MyChart, go to [email or web address].   Other Instructions  Your physician wants you to follow-up in: 1 year.  You will receive a reminder letter in the mail two months in advance. If you don't receive a letter, please call our office to schedule the follow-up appointment.

## 2024-03-06 NOTE — Progress Notes (Signed)
 Cardiology Office Note   Date:  03/06/2024  ID:  Fernando Hernandez, DOB 1965/11/29, MRN [id number] PCP: Berta Brittle, MD (Inactive)  [facility] Providers Cardiologist:  None     PMH Dyslipidemia Family history early CAD Coronary artery disease CT Calcium score 91.8 (80th percentile) Elevated LP(a) - 109.8 nmol/L  Referred to advanced lipid disorder clinic and seen by Dr. Maximo Spar 12/19/2023.  He has a history of coronary artery calcification seen on calcium score November 2023 of 91.8, 80th percentile for age/sex matched controls.  LP(a) is mildly elevated at 109.8 nmol/L.  Recent lipid panel showed total cholesterol 188, triglycerides 96, HDL 83, and LDL 88.  He is on rosuvastatin 40 mg daily.  Remote check of high-sensitivity CRP 2.0 in 2022.  The circumstances of that are not clear.  His father had MI in his 24s and ultimately had CABG.  He is vigilant about diet as far as reducing saturated fat and getting regular exercise.  He was advised to start Repatha  140 mg every 14 days.  History of Present Illness History of Present Illness Fernando Hernandez is a very pleasant 58 year old male who is here today for follow-up of dyslipidemia. He reports he is feeling well and remains active with regular work outs 4-5 times per week which involves running, weight lifting and various other HIIT type exercises. Despite regular exercise, he experiences shortness of breath when walking upstairs, but not with sprints or heavy lifting. He denies chest discomfort, orthopnea, PND, edema, presyncope, syncope or palpitations. He is concerned about his cardiovascular health due to a family history of heart disease. He is on Repatha , Crestor 40 mg, and a daily baby aspirin. His C-reactive protein is 1.21, and lipoprotein A levels remain unchanged. Blood pressure has been better controlled since being on multiple anti-hypertensive agents. His diet lacks fruits and vegetables but he generally eats healthy  food. He avoids fast food and red meat and generally brings salads to work. Occasional Etoh and sweets.   ROS: See HPI  Studies Reviewed EKG Interpretation Date/Time:  Wednesday March 06 2024 10:58:10 EDT Ventricular Rate:  67 PR Interval:  200 QRS Duration:  92 QT Interval:  384 QTC Calculation: 405 R Axis:   69  Text Interpretation: Normal sinus rhythm Minimal voltage criteria for LVH, may be normal variant ( Sokolow-Lyon ) No previous ECGs available Confirmed by Slater Duncan [phone]) on 03/06/2024 11:13:21 AM    No results found for: "LIPOA"  Risk Assessment/Calculations           Physical Exam VS:  BP 122/82   Pulse 67   Ht 6' (1.829 m)   Wt 185 lb (83.9 kg)   SpO2 98%   BMI 25.09 kg/m    Wt Readings from Last 3 Encounters:  03/06/24 185 lb (83.9 kg)  12/19/23 186 lb 8 oz (84.6 kg)  11/12/18 185 lb (83.9 kg)    GEN: Well nourished, well developed in no acute distress NECK: No JVD; No carotid bruits CARDIAC: RRR, no murmurs, rubs, gallops RESPIRATORY:  Clear to auscultation without rales, wheezing or rhonchi  ABDOMEN: Soft, non-tender, non-distended EXTREMITIES:  No edema; No deformity   ASSESSMENT AND PLAN  Assessment & Plan Coronary artery calcification   Elevated coronary calcium score 91.8 (80th percentile). We discussed ASCVD risk factors.  He has significant family history of early CAD as well as history of hyperlipidemia hypertension.  He is active with regular workouts and denies chest pain, dyspnea, or other  symptoms concerning for angina. We are pursuing echocardiogram in the setting of abnormal EKG as noted below. Will evaluate for abnormal wall motion and depressed heart function. Encouraged him to continue to focus on secondary prevention including heart healthy mostly plant based diet avoiding saturated fat, processed foods, simple carbohydrates, and sugar along with aiming for at least 150 minutes of moderate intensity exercise each week. Continue asa,  rosuvastatin, amlodipine, metoprolol.   Abnormal EKG EKG suggests possible left ventricular hypertrophy.  Admits to shortness of breath with climbing stairs but not with sprinting or heavy weightlifting.  He thinks he may have had an echocardiogram several years ago but is uncertain. We will get echo to evaluate heart and valve function.    Hypertension   Blood pressure initially elevated but improved on my recheck.  No change in antihypertensive therapy today.  Management per PCP.  Hyperlipidemia LDL goal < 70 He had repeat lipid testing last week but result of NMR is still pending. We contacted [facility] for assistance with this.  Lipoprotein a is unchanged. Will await lab results. Target LDL < 70.  Continue Repatha  140 mg twice monthly and Crestor 40 mg.   Elevated lipoprotein(a)   Lipoprotein(a) is unchanged at 109 mg/dL despite being on [id number] therapy.  Continue Repatha  to manage LDL levels and monitor lipoprotein(a) levels.         Dispo: 1 year with Dr. Maximo Spar or me  Signed, Slater Duncan, NP-C

## 2024-03-12 LAB — NMR, LIPOPROFILE
Cholesterol, Total: 169 mg/dL (ref 100–199)
HDL Particle Number: 39.2 umol/L (ref >=30.5)
HDL-C: 97 mg/dL (ref >39)
LDL Particle Number: <300 nmol/L (ref <1000)
LDL-C (NIH Calc): 53 mg/dL (ref 0–99)
LP-IR Score: 36 (ref <=45)
Small LDL Particle Number: <90 nmol/L (ref <=527)
Triglycerides: 109 mg/dL (ref 0–149)

## 2024-03-12 LAB — LIPOPROTEIN A (LPA): Lipoprotein (a): 109.2 nmol/L — ABNORMAL HIGH (ref <75.0)

## 2024-03-12 LAB — HIGH SENSITIVITY CRP: CRP, High Sensitivity: 1.21 mg/L (ref 0.00–3.00)

## 2024-03-22 ENCOUNTER — Ambulatory Visit (HOSPITAL_BASED_OUTPATIENT_CLINIC_OR_DEPARTMENT_OTHER): Payer: Self-pay | Admitting: Internal Medicine

## 2024-04-11 ENCOUNTER — Ambulatory Visit (INDEPENDENT_AMBULATORY_CARE_PROVIDER_SITE_OTHER)

## 2024-04-11 DIAGNOSIS — E7841 Elevated Lipoprotein(a): Secondary | ICD-10-CM | POA: Diagnosis not present

## 2024-04-11 DIAGNOSIS — E785 Hyperlipidemia, unspecified: Secondary | ICD-10-CM

## 2024-04-11 DIAGNOSIS — I2510 Atherosclerotic heart disease of native coronary artery without angina pectoris: Secondary | ICD-10-CM | POA: Diagnosis not present

## 2024-04-11 DIAGNOSIS — R9431 Abnormal electrocardiogram [ECG] [EKG]: Secondary | ICD-10-CM | POA: Diagnosis not present

## 2024-04-11 LAB — ECHOCARDIOGRAM COMPLETE
Area-P 1/2: 3.65 cm2
Est EF: 55

## 2024-04-12 ENCOUNTER — Ambulatory Visit: Payer: Self-pay | Admitting: Nurse Practitioner

## 2024-07-26 ENCOUNTER — Ambulatory Visit (HOSPITAL_BASED_OUTPATIENT_CLINIC_OR_DEPARTMENT_OTHER)

## 2024-07-26 ENCOUNTER — Other Ambulatory Visit (HOSPITAL_BASED_OUTPATIENT_CLINIC_OR_DEPARTMENT_OTHER): Payer: Self-pay

## 2024-07-26 ENCOUNTER — Ambulatory Visit (INDEPENDENT_AMBULATORY_CARE_PROVIDER_SITE_OTHER): Admitting: Orthopaedic Surgery

## 2024-07-26 ENCOUNTER — Ambulatory Visit (HOSPITAL_BASED_OUTPATIENT_CLINIC_OR_DEPARTMENT_OTHER): Payer: Self-pay | Admitting: Orthopaedic Surgery

## 2024-07-26 DIAGNOSIS — G8929 Other chronic pain: Secondary | ICD-10-CM

## 2024-07-26 DIAGNOSIS — M25572 Pain in left ankle and joints of left foot: Secondary | ICD-10-CM

## 2024-07-26 MED ORDER — ACETAMINOPHEN 500 MG PO TABS
500.0000 mg | ORAL_TABLET | Freq: Three times a day (TID) | ORAL | 0 refills | Status: AC
  Filled 2024-07-26: qty 30, 10d supply, fill #0

## 2024-07-26 MED ORDER — OXYCODONE HCL 5 MG PO TABS
5.0000 mg | ORAL_TABLET | ORAL | 0 refills | Status: AC | PRN
  Filled 2024-07-26: qty 10, 2d supply, fill #0

## 2024-07-26 MED ORDER — IBUPROFEN 800 MG PO TABS
800.0000 mg | ORAL_TABLET | Freq: Three times a day (TID) | ORAL | 0 refills | Status: AC
  Filled 2024-07-26: qty 30, 10d supply, fill #0

## 2024-07-26 MED ORDER — ASPIRIN 325 MG PO TBEC
325.0000 mg | DELAYED_RELEASE_TABLET | Freq: Every day | ORAL | 0 refills | Status: AC
  Filled 2024-07-26: qty 14, 14d supply, fill #0

## 2024-07-26 NOTE — Progress Notes (Signed)
 Chief Complaint: Left ankle pain     History of Present Illness:    Fernando Hernandez is a 58 y.o. male presents today with ongoing symptomatic left ankle hardware after an initial injury 2 years ago where he had a fibula fixation and syndesmotic fixation for open ankle fracture.  At this time he is subsequently gone on to healing although unfortunately he is still having symptomatic pain directly over the lateral fibula.  This has been irritating him.  This is uncomfortable with shoewear or for being more active and longer periods.  He is still very active and enjoys playing golf.    PMH/PSH/Family History/Social History/Meds/Allergies:    Past Medical History:  Diagnosis Date   Attention deficit hyperactivity disorder (ADHD)    Family history of early CAD    Hyperlipidemia    Left varicocele    Metabolic syndrome    Rotator cuff syndrome    No past surgical history on file. Social History   Socioeconomic History   Marital status: Married    Spouse name: Not on file   Number of children: Not on file   Years of education: Not on file   Highest education level: Not on file  Occupational History   Not on file  Tobacco Use   Smoking status: Never   Smokeless tobacco: Never  Substance and Sexual Activity   Alcohol use: Yes    Comment: occasion   Drug use: No   Sexual activity: Not on file  Other Topics Concern   Not on file  Social History Narrative   Not on file   Social Drivers of Health   Financial Resource Strain: Not on file  Food Insecurity: No Food Insecurity (12/19/2023)   Hunger Vital Sign    Worried About Running Out of Food in the Last Year: Never true    Ran Out of Food in the Last Year: Never true  Transportation Needs: No Transportation Needs (12/19/2023)   PRAPARE - Administrator, Civil Service (Medical): No    Lack of Transportation (Non-Medical): No  Physical Activity: Sufficiently Active (12/19/2023)   Exercise Vital Sign     Days of Exercise per Week: 4 days    Minutes of Exercise per Session: 50 min  Stress: Not on file  Social Connections: Not on file   Family History  Problem Relation Age of Onset   Heart attack Father    Coronary artery disease Father    No Known Allergies Current Outpatient Medications  Medication Sig Dispense Refill   acetaminophen (TYLENOL) 500 MG tablet Take 1 tablet (500 mg total) by mouth every 8 (eight) hours for 10 days. 30 tablet 0   aspirin EC 325 MG tablet Take 1 tablet (325 mg total) by mouth daily. 14 tablet 0   ibuprofen (ADVIL) 800 MG tablet Take 1 tablet (800 mg total) by mouth every 8 (eight) hours for 10 days. Please take with food, please alternate with acetaminophen 30 tablet 0   oxyCODONE (ROXICODONE) 5 MG immediate release tablet Take 1 tablet (5 mg total) by mouth every 4 (four) hours as needed for severe pain (pain score 7-10) or breakthrough pain. 10 tablet 0   amLODipine (NORVASC) 10 MG tablet Take 10 mg by mouth daily.     amphetamine-dextroamphetamine (ADDERALL) 15 MG tablet Take 15 mg by mouth daily.     aspirin 81 MG tablet Take 81 mg by mouth daily.     Evolocumab  (REPATHA  SURECLICK) 140 MG/ML  SOAJ Inject 140 mg into the skin every 14 (fourteen) days. 6 mL 3   hydrochlorothiazide (HYDRODIURIL) 12.5 MG tablet Take 12.5 mg by mouth daily.     losartan (COZAAR) 100 MG tablet Take 100 mg by mouth daily. Takes 1 tab in the morning, 1/2 tab at night.     metoprolol succinate (TOPROL-XL) 25 MG 24 hr tablet Take 25 mg by mouth daily.     rosuvastatin (CRESTOR) 40 MG tablet Take 40 mg by mouth daily.     zolpidem (AMBIEN) 10 MG tablet Take 10 mg by mouth at bedtime as needed for sleep.     No current facility-administered medications for this visit.   No results found.  Review of Systems:   A ROS was performed including pertinent positives and negatives as documented in the HPI.  Physical Exam :   Constitutional: NAD and appears stated age Neurological: Alert  and oriented Psych: Appropriate affect and cooperative There were no vitals taken for this visit.   Comprehensive Musculoskeletal Exam:    Left ankle incisions well-appearing without erythema or drainage.  Dorsiflexion of the left foot is 20 degrees dorsi and plantarflexion.  Distal neurosensory exams intact no redness or erythema   Imaging:   Xray (3 views left ankle): Status post fibula fixation with healed fibula and all suture tib-fib device.  There does appear to be synostosis of the tib-fib joint.  There is some lateral talar cyst formations    I personally reviewed and interpreted the radiographs.   Assessment and Plan:   58 y.o. male with symptomatic left ankle hardware after previous fibula fixation.  I did discuss that given the fact that this is symptomatic and there is x-ray evidence of over constraint of the distal tib-fib joint that I would ultimately recommend hardware removal as well as removal of the tight rope device.  I did discuss risks and limitations as well as associated recovery timeframe.  After discussion he would like to proceed  -Plan for left ankle removal of hardware    After a lengthy discussion of treatment options, including risks, benefits, alternatives, complications of surgical and nonsurgical conservative options, the patient elected surgical repair.   The patient  is aware of the material risks  and complications including, but not limited to injury to adjacent structures, neurovascular injury, infection, numbness, bleeding, implant failure, thermal burns, stiffness, persistent pain, failure to heal, disease transmission from allograft, need for further surgery, dislocation, anesthetic risks, blood clots, risks of death,and others. The probabilities of surgical success and failure discussed with patient given their particular co-morbidities.The time and nature of expected rehabilitation and recovery was discussed.The patient's questions were all  answered preoperatively.  No barriers to understanding were noted. I explained the natural history of the disease process and Rx rationale.  I explained to the patient what I considered to be reasonable expectations given their personal situation.  The final treatment plan was arrived at through a shared patient decision making process model.    I personally saw and evaluated the patient, and participated in the management and treatment plan.  Elspeth Parker, MD Attending Physician, Orthopedic Surgery  This document was dictated using Dragon voice recognition software. A reasonable attempt at proof reading has been made to minimize errors.

## 2024-08-05 ENCOUNTER — Encounter: Payer: Self-pay | Admitting: Radiology

## 2024-09-03 ENCOUNTER — Other Ambulatory Visit: Payer: Self-pay

## 2024-09-03 ENCOUNTER — Encounter (HOSPITAL_BASED_OUTPATIENT_CLINIC_OR_DEPARTMENT_OTHER): Payer: Self-pay | Admitting: Orthopaedic Surgery

## 2024-09-06 ENCOUNTER — Encounter (HOSPITAL_BASED_OUTPATIENT_CLINIC_OR_DEPARTMENT_OTHER)
Admission: RE | Admit: 2024-09-06 | Discharge: 2024-09-06 | Disposition: A | Source: Ambulatory Visit | Attending: Orthopaedic Surgery

## 2024-09-06 LAB — BASIC METABOLIC PANEL WITH GFR
Anion gap: 7 (ref 5–15)
BUN: 11 mg/dL (ref 6–20)
CO2: 27 mmol/L (ref 22–32)
Calcium: 9.6 mg/dL (ref 8.9–10.3)
Chloride: 100 mmol/L (ref 98–111)
Creatinine, Ser: 1.13 mg/dL (ref 0.61–1.24)
GFR, Estimated: >60 mL/min (ref >60)
Glucose, Bld: 127 mg/dL — ABNORMAL HIGH (ref 70–99)
Potassium: 4.2 mmol/L (ref 3.5–5.1)
Sodium: 134 mmol/L — ABNORMAL LOW (ref 135–145)

## 2024-09-06 NOTE — Progress Notes (Signed)

## 2024-09-10 ENCOUNTER — Ambulatory Visit (HOSPITAL_BASED_OUTPATIENT_CLINIC_OR_DEPARTMENT_OTHER)

## 2024-09-10 ENCOUNTER — Encounter (HOSPITAL_BASED_OUTPATIENT_CLINIC_OR_DEPARTMENT_OTHER): Admission: RE | Disposition: A | Payer: Self-pay | Source: Home / Self Care | Attending: Orthopaedic Surgery

## 2024-09-10 ENCOUNTER — Encounter (HOSPITAL_BASED_OUTPATIENT_CLINIC_OR_DEPARTMENT_OTHER): Payer: Self-pay | Admitting: Orthopaedic Surgery

## 2024-09-10 ENCOUNTER — Ambulatory Visit (HOSPITAL_BASED_OUTPATIENT_CLINIC_OR_DEPARTMENT_OTHER)
Admission: RE | Admit: 2024-09-10 | Discharge: 2024-09-10 | Disposition: A | Discharge status: Home / Self Care | Attending: Orthopaedic Surgery | Admitting: Orthopaedic Surgery

## 2024-09-10 ENCOUNTER — Other Ambulatory Visit: Payer: Self-pay

## 2024-09-10 ENCOUNTER — Ambulatory Visit (HOSPITAL_BASED_OUTPATIENT_CLINIC_OR_DEPARTMENT_OTHER): Admitting: Certified Registered"

## 2024-09-10 DIAGNOSIS — I10 Essential (primary) hypertension: Secondary | ICD-10-CM

## 2024-09-10 DIAGNOSIS — G8929 Other chronic pain: Secondary | ICD-10-CM

## 2024-09-10 DIAGNOSIS — Z969 Presence of functional implant, unspecified: Secondary | ICD-10-CM

## 2024-09-10 HISTORY — PX: HARDWARE REMOVAL: SHX979

## 2024-09-10 HISTORY — DX: Essential (primary) hypertension: I10

## 2024-09-10 SURGERY — REMOVAL, HARDWARE
Anesthesia: General | Site: Ankle | Laterality: Left

## 2024-09-10 MED ORDER — OXYCODONE HCL 5 MG PO TABS
5.0000 mg | ORAL_TABLET | Freq: Once | ORAL | Status: AC | PRN
  Administered 2024-09-10: 5 mg via ORAL

## 2024-09-10 MED ORDER — 0.9 % SODIUM CHLORIDE (POUR BTL) OPTIME
TOPICAL | Status: DC | PRN
  Administered 2024-09-10 (×2): 1000 mL

## 2024-09-10 MED ORDER — MIDAZOLAM HCL 2 MG/2ML IJ SOLN
INTRAMUSCULAR | Status: AC
  Filled 2024-09-10: qty 2

## 2024-09-10 MED ORDER — BUPIVACAINE HCL (PF) 0.25 % IJ SOLN
INTRAMUSCULAR | Status: DC | PRN
  Administered 2024-09-10: 20 mL

## 2024-09-10 MED ORDER — LIDOCAINE 2% (20 MG/ML) 5 ML SYRINGE
INTRAMUSCULAR | Status: DC | PRN
  Administered 2024-09-10: 60 mg via INTRAVENOUS

## 2024-09-10 MED ORDER — CEFAZOLIN SODIUM-DEXTROSE 2-4 GM/100ML-% IV SOLN
INTRAVENOUS | Status: AC
  Filled 2024-09-10: qty 100

## 2024-09-10 MED ORDER — GABAPENTIN 300 MG PO CAPS
ORAL_CAPSULE | ORAL | Status: AC
  Filled 2024-09-10: qty 1

## 2024-09-10 MED ORDER — ACETAMINOPHEN 500 MG PO TABS
ORAL_TABLET | ORAL | Status: AC
  Filled 2024-09-10: qty 2

## 2024-09-10 MED ORDER — BUPIVACAINE HCL (PF) 0.25 % IJ SOLN
INTRAMUSCULAR | Status: AC
  Filled 2024-09-10: qty 30

## 2024-09-10 MED ORDER — OXYCODONE HCL 5 MG PO TABS
ORAL_TABLET | ORAL | Status: AC
  Filled 2024-09-10: qty 1

## 2024-09-10 MED ORDER — PROPOFOL 10 MG/ML IV BOLUS
INTRAVENOUS | Status: DC | PRN
  Administered 2024-09-10: 200 mg via INTRAVENOUS

## 2024-09-10 MED ORDER — DROPERIDOL 2.5 MG/ML IJ SOLN: 0.6250 mg | Freq: Once | INTRAMUSCULAR | Status: DC | PRN

## 2024-09-10 MED ORDER — MIDAZOLAM HCL (PF) 2 MG/2ML IJ SOLN
INTRAMUSCULAR | Status: DC | PRN
  Administered 2024-09-10: 2 mg via INTRAVENOUS

## 2024-09-10 MED ORDER — DEXAMETHASONE SOD PHOSPHATE PF 10 MG/ML IJ SOLN
INTRAMUSCULAR | Status: DC | PRN
  Administered 2024-09-10: 5 mg via INTRAVENOUS

## 2024-09-10 MED ORDER — TRANEXAMIC ACID-NACL 1000-0.7 MG/100ML-% IV SOLN
INTRAVENOUS | Status: AC
  Filled 2024-09-10: qty 100

## 2024-09-10 MED ORDER — LACTATED RINGERS IV SOLN: INTRAVENOUS | Status: DC

## 2024-09-10 MED ORDER — GABAPENTIN 300 MG PO CAPS
300.0000 mg | ORAL_CAPSULE | Freq: Once | ORAL | Status: AC
  Administered 2024-09-10: 300 mg via ORAL

## 2024-09-10 MED ORDER — FENTANYL CITRATE (PF) 100 MCG/2ML IJ SOLN
INTRAMUSCULAR | Status: DC | PRN
  Administered 2024-09-10 (×2): 50 ug via INTRAVENOUS

## 2024-09-10 MED ORDER — FENTANYL CITRATE (PF) 100 MCG/2ML IJ SOLN
INTRAMUSCULAR | Status: AC
  Filled 2024-09-10: qty 2

## 2024-09-10 MED ORDER — ACETAMINOPHEN 500 MG PO TABS
1000.0000 mg | ORAL_TABLET | Freq: Once | ORAL | Status: AC
  Administered 2024-09-10: 1000 mg via ORAL

## 2024-09-10 MED ORDER — LIDOCAINE 2% (20 MG/ML) 5 ML SYRINGE
INTRAMUSCULAR | Status: AC
  Filled 2024-09-10: qty 5

## 2024-09-10 MED ORDER — OXYCODONE HCL 5 MG/5ML PO SOLN: 5.0000 mg | Freq: Once | ORAL | Status: AC | PRN

## 2024-09-10 MED ORDER — CEFAZOLIN SODIUM-DEXTROSE 2-4 GM/100ML-% IV SOLN
2.0000 g | INTRAVENOUS | Status: AC
  Administered 2024-09-10: 2 g via INTRAVENOUS

## 2024-09-10 MED ORDER — PROPOFOL 10 MG/ML IV BOLUS
INTRAVENOUS | Status: AC
  Filled 2024-09-10: qty 20

## 2024-09-10 MED ORDER — TRANEXAMIC ACID-NACL 1000-0.7 MG/100ML-% IV SOLN
1000.0000 mg | INTRAVENOUS | Status: AC
  Administered 2024-09-10: 1000 mg via INTRAVENOUS

## 2024-09-10 MED ORDER — FENTANYL CITRATE (PF) 100 MCG/2ML IJ SOLN: 25.0000 ug | INTRAMUSCULAR | Status: DC | PRN

## 2024-09-10 MED ORDER — ONDANSETRON HCL 4 MG/2ML IJ SOLN
INTRAMUSCULAR | Status: DC | PRN
  Administered 2024-09-10: 4 mg via INTRAVENOUS

## 2024-09-10 SURGICAL SUPPLY — 39 items
BENZOIN TINCTURE PRP APPL 2/3 (GAUZE/BANDAGES/DRESSINGS) IMPLANT
BLADE SURG 15 STRL LF DISP TIS (BLADE) ×1 IMPLANT
BNDG ELASTIC 4INX 5YD STR LF (GAUZE/BANDAGES/DRESSINGS) ×1 IMPLANT
CHLORAPREP W/TINT 26 (MISCELLANEOUS) ×1 IMPLANT
CLSR STERI-STRIP ANTIMIC 1/2X4 (GAUZE/BANDAGES/DRESSINGS) IMPLANT
DRAPE EXTREMITY T 121X128X90 (DISPOSABLE) IMPLANT
DRAPE IMP U-DRAPE 54X76 (DRAPES) IMPLANT
DRAPE INCISE IOBAN 66X45 STRL (DRAPES) IMPLANT
DRAPE U-SHAPE 47X51 STRL (DRAPES) ×2 IMPLANT
DRSG TEGADERM 4X4.75 (GAUZE/BANDAGES/DRESSINGS) IMPLANT
ELECTRODE REM PT RTRN 9FT ADLT (ELECTROSURGICAL) ×1 IMPLANT
GAUZE PAD ABD 8X10 STRL (GAUZE/BANDAGES/DRESSINGS) ×1 IMPLANT
GAUZE SPONGE 4X4 12PLY STRL (GAUZE/BANDAGES/DRESSINGS) ×1 IMPLANT
GAUZE XEROFORM 1X8 LF (GAUZE/BANDAGES/DRESSINGS) ×1 IMPLANT
GLOVE BIO SURGEON STRL SZ 6 (GLOVE) ×2 IMPLANT
GLOVE BIO SURGEON STRL SZ 6.5 (GLOVE) IMPLANT
GLOVE BIO SURGEON STRL SZ7 (GLOVE) IMPLANT
GLOVE BIO SURGEON STRL SZ7.5 (GLOVE) ×1 IMPLANT
GLOVE BIOGEL PI IND STRL 6.5 (GLOVE) ×1 IMPLANT
GLOVE BIOGEL PI IND STRL 7.0 (GLOVE) IMPLANT
GLOVE BIOGEL PI IND STRL 8 (GLOVE) ×1 IMPLANT
GOWN STRL REUS W/ TWL LRG LVL3 (GOWN DISPOSABLE) ×2 IMPLANT
GOWN STRL REUS W/TWL XL LVL3 (GOWN DISPOSABLE) ×1 IMPLANT
PACK ARTHROSCOPY DSU (CUSTOM PROCEDURE TRAY) ×1 IMPLANT
PACK BASIN DAY SURGERY FS (CUSTOM PROCEDURE TRAY) ×1 IMPLANT
PENCIL SMOKE EVACUATOR (MISCELLANEOUS) ×1 IMPLANT
SHEET MEDIUM DRAPE 40X70 STRL (DRAPES) ×1 IMPLANT
SLEEVE SCD COMPRESS KNEE MED (STOCKING) ×1 IMPLANT
SPONGE T-LAP 4X18 ~~LOC~~+RFID (SPONGE) ×1 IMPLANT
SUCTION TUBE FRAZIER 10FR DISP (SUCTIONS) ×1 IMPLANT
SUT ETHILON 3 0 PS 1 (SUTURE) IMPLANT
SUT MNCRL AB 3-0 PS2 27 (SUTURE) IMPLANT
SUT VIC AB 0 CT1 27XBRD ANBCTR (SUTURE) IMPLANT
SUT VIC AB 2-0 CT1 TAPERPNT 27 (SUTURE) IMPLANT
SUT VIC AB 2-0 SH 27XBRD (SUTURE) IMPLANT
SYR BULB EAR ULCER 3OZ GRN STR (SYRINGE) ×1 IMPLANT
TOWEL GREEN STERILE FF (TOWEL DISPOSABLE) ×2 IMPLANT
TUBE CONNECTING 20X1/4 (TUBING) IMPLANT
YANKAUER SUCT BULB TIP NO VENT (SUCTIONS) IMPLANT

## 2024-09-10 NOTE — Interval H&P Note (Signed)
 History and Physical Interval Note:  09/10/2024 11:15 AM  Fernando Hernandez  has presented today for surgery, with the diagnosis of LEFT ANKLE SYMPTOMATIC HARDWARE.  The various methods of treatment have been discussed with the patient and family. After consideration of risks, benefits and other options for treatment, the patient has consented to  Procedure(s) with comments: REMOVAL, HARDWARE (Left) - LEFT ANKLE REMOVAL OF HARDWARE as a surgical intervention.  The patient's history has been reviewed, patient examined, no change in status, stable for surgery.  I have reviewed the patient's chart and labs.  Questions were answered to the patient's satisfaction.     Gurjit Loconte

## 2024-09-10 NOTE — Anesthesia Preprocedure Evaluation (Signed)
 Anesthesia Evaluation  Patient identified by MRN, date of birth, ID band Patient awake    Reviewed: Allergy & Precautions, NPO status , Patient's Chart, lab work & pertinent test results  Airway Mallampati: II  TM Distance: >3 FB Neck ROM: Full    Dental  (+) Dental Advisory Given   Pulmonary neg pulmonary ROS   breath sounds clear to auscultation       Cardiovascular hypertension, Pt. on medications and Pt. on home beta blockers  Rhythm:Regular Rate:Normal     Neuro/Psych negative neurological ROS     GI/Hepatic negative GI ROS, Neg liver ROS,,,  Endo/Other  negative endocrine ROS    Renal/GU negative Renal ROS     Musculoskeletal   Abdominal   Peds  Hematology negative hematology ROS (+)   Anesthesia Other Findings   Reproductive/Obstetrics                              Anesthesia Physical Anesthesia Plan  ASA: 2  Anesthesia Plan: General   Post-op Pain Management: Tylenol  PO (pre-op)* and Toradol IV (intra-op)*   Induction: Intravenous  PONV Risk Score and Plan: 2 and Dexamethasone , Ondansetron , Midazolam  and Treatment may vary due to age or medical condition  Airway Management Planned: LMA  Additional Equipment: None  Intra-op Plan:   Post-operative Plan: Extubation in OR  Informed Consent: I have reviewed the patients History and Physical, chart, labs and discussed the procedure including the risks, benefits and alternatives for the proposed anesthesia with the patient or authorized representative who has indicated his/her understanding and acceptance.     Dental advisory given  Plan Discussed with:   Anesthesia Plan Comments:         Anesthesia Quick Evaluation

## 2024-09-10 NOTE — Discharge Instructions (Addendum)
 Discharge Instructions    Attending Surgeon: Elspeth Parker, MD Office Phone Number: [phone]   Diagnosis and Procedures:    Surgeries Performed: Left ankle hardware removal  Discharge Plan:    Diet: Resume usual diet. Begin with light or bland foods.  Drink plenty of fluids.  Activity:  Weightbearing as tolerated left ankle. You are advised to go home directly from the hospital or surgical center. Restrict your activities.  GENERAL INSTRUCTIONS: 1.  Please apply ice to your wound to help with swelling and inflammation. This will improve your comfort and your overall recovery following surgery.     2. Please call Dr. Danetta office at [phone] with questions Monday-Friday during business hours. If no one answers, please leave a message and someone should get back to the patient within 24 hours. For emergencies please call 911 or proceed to the emergency room.   3. Patient to notify surgical team if experiences any of the following: Bowel/Bladder dysfunction, uncontrolled pain, nerve/muscle weakness, incision with increased drainage or redness, nausea/vomiting and Fever greater than 101.0 F.  Be alert for signs of infection including redness, streaking, odor, fever or chills. Be alert for excessive pain or bleeding and notify your surgeon immediately.  WOUND INSTRUCTIONS:   Your dressing is waterproof and you may shower with this.  You may remove the dressing on Friday and leave the wound exposed to air  Always keep the incision clean and dry until the staples/sutures are removed. If there is no drainage from the incision you should keep it open to air. If there is drainage from the incision you must keep it covered at all times until the drainage stops  Do not soak in a bath tub, hot tub, pool, lake or other body of water until 21 days after your surgery and your incision is completely dry and healed.  If you have removable sutures (or staples) they must be  removed 10-14 days (unless otherwise instructed) from the day of your surgery.     1)  Elevate the extremity as much as possible.  2)  Keep the dressing clean and dry.  3)  Please call us  if the dressing becomes wet or dirty.  4)  If you are experiencing worsening pain or worsening swelling, please call.     MEDICATIONS: Resume all previous home medications at the previous prescribed dose and frequency unless otherwise noted Start taking the  pain medications on an as-needed basis as prescribed  Please taper down pain medication over the next week following surgery.  Ideally you should not require a refill of any narcotic pain medication.  Take pain medication with food to minimize nausea. In addition to the prescribed pain medication, you may take over-the-counter pain relievers such as Tylenol .  Do NOT take additional tylenol  if your pain medication already has tylenol  in it.  Aspirin  325mg  daily per instructions on bottle. Narcotic policy: Per [facility] clinic policy, our goal is ensure optimal postoperative pain control with a multimodal pain management strategy. For all [facility] patients, our goal is to wean post-operative narcotic medications by 6 weeks post-operatively, and many times sooner. If this is not possible due to utilization of pain medication prior to surgery, your [facility] doctor will support your acute post-operative pain control for the first 6 weeks postoperatively, with a plan to transition you back to your primary pain team following that. Maralee will work to ensure a therapist, occupational.       FOLLOWUP INSTRUCTIONS: 1. Follow  up at the Physical Therapy Clinic 3-4 days following surgery. This appointment should be scheduled unless other arrangements have been made.The Physical Therapy scheduling number is [phone] if an appointment has not already been arranged.  2. Contact Dr. Danetta office during office hours at [phone] or the practice after hours line  at [phone] for non-emergencies. For medical emergencies call 911.   Discharge Location: Home   Post Anesthesia Home Care Instructions  Activity: Get plenty of rest for the remainder of the day. A responsible individual must stay with you for 24 hours following the procedure.  For the next 24 hours, DO NOT: -Drive a car -Advertising copywriter -Drink alcoholic beverages -Take any medication unless instructed by your physician -Make any legal decisions or sign important papers.  Meals: Start with liquid foods such as gelatin or soup. Progress to regular foods as tolerated. Avoid greasy, spicy, heavy foods. If nausea and/or vomiting occur, drink only clear liquids until the nausea and/or vomiting subsides. Call your physician if vomiting continues.  Special Instructions/Symptoms: Your throat may feel dry or sore from the anesthesia or the breathing tube placed in your throat during surgery. If this causes discomfort, gargle with warm salt water. The discomfort should disappear within 24 hours.  May have Tylenol  after 5:15pm if needed.

## 2024-09-10 NOTE — Anesthesia Procedure Notes (Signed)
 Procedure Name: LMA Insertion Date/Time: 09/10/2024 11:43 AM  Performed by: Delayne Olam BIRCH, CRNAPre-anesthesia Checklist: Patient identified, Emergency Drugs available, Suction available and Patient being monitored Patient Re-evaluated:Patient Re-evaluated prior to induction Oxygen Delivery Method: Circle system utilized Preoxygenation: Pre-oxygenation with 100% oxygen Induction Type: IV induction Ventilation: Mask ventilation without difficulty LMA: LMA inserted LMA Size: 5.0 Number of attempts: 1 Airway Equipment and Method: Bite block Placement Confirmation: positive ETCO2 Tube secured with: Tape Dental Injury: Teeth and Oropharynx as per pre-operative assessment

## 2024-09-10 NOTE — Op Note (Signed)
  Date of Surgery: 09/10/2024  INDICATIONS: Mr. Muegge is a 58 y.o.-year-old male with symptomatic left ankle hardware.  The risk and benefits of the procedure were discussed in detail and documented in the pre-operative evaluation.   PREOPERATIVE DIAGNOSIS: 1.  Symptomatic left ankle hardware  POSTOPERATIVE DIAGNOSIS: Same.  PROCEDURE: 1.  Symptomatic left ankle hardware removal  SURGEON: Elspeth LITTIE Parker MD  ASSISTANT: Conley Dawson, ATC  ANESTHESIA:  general  IV FLUIDS AND URINE: See anesthesia record.  ANTIBIOTICS: Ancef   ESTIMATED BLOOD LOSS: 10 mL.  IMPLANTS:  Implant Name Type Inv. Item Serial No. Manufacturer Lot No. LRB No. Used Action  IMPLANT RECORD       1 Implanted    DRAINS: None  CULTURES: None  COMPLICATIONS: none  DESCRIPTION OF PROCEDURE:   I identified the patient in the pre-operative holding area.  I marked the operative knee with my initials. I reviewed the risks and benefits of the proposed surgical intervention and the patient wished to proceed.  Anesthesia performed a peripheral nerve block.  Patient was subsequently taken back to the operating room.  The patient was transferred to the operative suite and placed in the supine position with all bony prominences padded.     SCDs were placed on the non-operative lower extremity. Appropriate antibiotics was administered within 1 hour before incision. The operative lower extremity was then prepped and draped in standard fashion. A time out was performed confirming the correct extremity, correct patient and correct procedure.    At this time incision was made over the lateral previous incision.  Electrocautery was used to achieve hemostasis and expose the plate.  Screws were removed and then a osteotome was used to remove the plate.  The tight rope mechanism was cut.  The wound was thoroughly irrigated.  A counterincision was made about his previous medial wound and the other end of the tight rope was removed.   Fluoroscopy confirmed complete removal of hardware.  That concluded the case.  Skin was closed with 2-0 Vicryl and 3-0 Monocryl Xeroform gauze, gauze, Tegaderm, Iceman and brace were applied.  Instrument, sponge, and needle counts were correct prior to wound closure and at the conclusion of the case.  The patient was taken to the PACU without complication     POSTOPERATIVE PLAN: He will be weightbearing as tolerated.  He will be activity as tolerated.  He has waterproof dressing and may shower.  He may discontinue his dressing on Friday following surgery  Elspeth LITTIE Parker, MD 12:29 PM

## 2024-09-10 NOTE — Anesthesia Postprocedure Evaluation (Signed)
 Anesthesia Post Note  Patient: Fernando Hernandez  Procedure(s) Performed: LEFT ANKLE REMOVAL OF HARDWARE (Left: Ankle)     Patient location during evaluation: PACU Anesthesia Type: General Level of consciousness: awake and alert Pain management: pain level controlled Vital Signs Assessment: post-procedure vital signs reviewed and stable Respiratory status: spontaneous breathing, nonlabored ventilation, respiratory function stable and patient connected to nasal cannula oxygen Cardiovascular status: blood pressure returned to baseline and stable Postop Assessment: no apparent nausea or vomiting Anesthetic complications: no   No notable events documented.  Last Vitals:  Vitals:   09/10/24 1245 09/10/24 1317  BP: 124/84 (!) 129/91  Pulse: 82 75  Resp: 16 16  Temp: (!) 36.4 C 36.5 C  SpO2: 97% 95%    Last Pain:  Vitals:   09/10/24 1317  TempSrc:   PainSc: 5                  Epifanio Lamar BRAVO

## 2024-09-10 NOTE — H&P (Signed)
 Expand All Collapse All       Chief Complaint: Left ankle pain        History of Present Illness:      Fernando Hernandez is a 58 y.o. male presents today with ongoing symptomatic left ankle hardware after an initial injury 2 years ago where he had a fibula fixation and syndesmotic fixation for open ankle fracture.  At this time he is subsequently gone on to healing although unfortunately he is still having symptomatic pain directly over the lateral fibula.  This has been irritating him.  This is uncomfortable with shoewear or for being more active and longer periods.  He is still very active and enjoys playing golf.       PMH/PSH/Family History/Social History/Meds/Allergies:         Past Medical History:  Diagnosis Date   Attention deficit hyperactivity disorder (ADHD)     Family history of early CAD     Hyperlipidemia     Left varicocele     Metabolic syndrome     Rotator cuff syndrome          No past surgical history on file.     Social History         Socioeconomic History   Marital status: Married      Spouse name: Not on file   Number of children: Not on file   Years of education: Not on file   Highest education level: Not on file  Occupational History   Not on file  Tobacco Use   Smoking status: Never   Smokeless tobacco: Never  Substance and Sexual Activity   Alcohol use: Yes      Comment: occasion   Drug use: No   Sexual activity: Not on file  Other Topics Concern   Not on file  Social History Narrative   Not on file    Social Drivers of Health        Financial Resource Strain: Not on file  Food Insecurity: No Food Insecurity (12/19/2023)    Hunger Vital Sign     Worried About Running Out of Food in the Last Year: Never true     Ran Out of Food in the Last Year: Never true  Transportation Needs: No Transportation Needs (12/19/2023)    PRAPARE - Therapist, Art (Medical): No     Lack of Transportation (Non-Medical):  No  Physical Activity: Sufficiently Active (12/19/2023)    Exercise Vital Sign     Days of Exercise per Week: 4 days     Minutes of Exercise per Session: 50 min  Stress: Not on file  Social Connections: Not on file         Family History  Problem Relation Age of Onset   Heart attack Father     Coronary artery disease Father          Allergies  No Known Allergies         Current Outpatient Medications  Medication Sig Dispense Refill   acetaminophen  (TYLENOL ) 500 MG tablet Take 1 tablet (500 mg total) by mouth every 8 (eight) hours for 10 days. 30 tablet 0   aspirin  EC 325 MG tablet Take 1 tablet (325 mg total) by mouth daily. 14 tablet 0   ibuprofen  (ADVIL ) 800 MG tablet Take 1 tablet (800 mg total) by mouth every 8 (eight) hours for 10 days. Please take with food, please alternate with acetaminophen  30 tablet 0  oxyCODONE  (ROXICODONE ) 5 MG immediate release tablet Take 1 tablet (5 mg total) by mouth every 4 (four) hours as needed for severe pain (pain score 7-10) or breakthrough pain. 10 tablet 0   amLODipine (NORVASC) 10 MG tablet Take 10 mg by mouth daily.       amphetamine-dextroamphetamine (ADDERALL) 15 MG tablet Take 15 mg by mouth daily.       aspirin  81 MG tablet Take 81 mg by mouth daily.       Evolocumab  (REPATHA  SURECLICK) 140 MG/ML SOAJ Inject 140 mg into the skin every 14 (fourteen) days. 6 mL 3   hydrochlorothiazide (HYDRODIURIL) 12.5 MG tablet Take 12.5 mg by mouth daily.       losartan (COZAAR) 100 MG tablet Take 100 mg by mouth daily. Takes 1 tab in the morning, 1/2 tab at night.       metoprolol succinate (TOPROL-XL) 25 MG 24 hr tablet Take 25 mg by mouth daily.       rosuvastatin (CRESTOR) 40 MG tablet Take 40 mg by mouth daily.       zolpidem (AMBIEN) 10 MG tablet Take 10 mg by mouth at bedtime as needed for sleep.          No current facility-administered medications for this visit.      Imaging Results (Last 48 hours)  No results found.     Review of  Systems:   A ROS was performed including pertinent positives and negatives as documented in the HPI.   Physical Exam :   Constitutional: NAD and appears stated age Neurological: Alert and oriented Psych: Appropriate affect and cooperative There were no vitals taken for this visit.    Comprehensive Musculoskeletal Exam:     Left ankle incisions well-appearing without erythema or drainage.  Dorsiflexion of the left foot is 20 degrees dorsi and plantarflexion.  Distal neurosensory exams intact no redness or erythema     Imaging:   Xray (3 views left ankle): Status post fibula fixation with healed fibula and all suture tib-fib device.  There does appear to be synostosis of the tib-fib joint.  There is some lateral talar cyst formations       I personally reviewed and interpreted the radiographs.     Assessment and Plan:   58 y.o. male with symptomatic left ankle hardware after previous fibula fixation.  I did discuss that given the fact that this is symptomatic and there is x-ray evidence of over constraint of the distal tib-fib joint that I would ultimately recommend hardware removal as well as removal of the tight rope device.  I did discuss risks and limitations as well as associated recovery timeframe.  After discussion he would like to proceed   -Plan for left ankle removal of hardware       After a lengthy discussion of treatment options, including risks, benefits, alternatives, complications of surgical and nonsurgical conservative options, the patient elected surgical repair.    The patient  is aware of the material risks  and complications including, but not limited to injury to adjacent structures, neurovascular injury, infection, numbness, bleeding, implant failure, thermal burns, stiffness, persistent pain, failure to heal, disease transmission from allograft, need for further surgery, dislocation, anesthetic risks, blood clots, risks of death,and others. The probabilities of  surgical success and failure discussed with patient given their particular co-morbidities.The time and nature of expected rehabilitation and recovery was discussed.The patient's questions were all answered preoperatively.  No barriers to understanding were noted. I explained the  natural history of the disease process and Rx rationale.  I explained to the patient what I considered to be reasonable expectations given their personal situation.  The final treatment plan was arrived at through a shared patient decision making process model.       I personally saw and evaluated the patient, and participated in the management and treatment plan.   Elspeth Parker, MD Attending Physician, Orthopedic Surgery   This document was dictated using Dragon voice recognition software. A reasonable attempt at proof reading has been made to minimize errors.

## 2024-09-10 NOTE — Transfer of Care (Signed)
 Immediate Anesthesia Transfer of Care Note  Patient: Fernando Hernandez  Procedure(s) Performed: Procedure(s) (LRB): LEFT ANKLE REMOVAL OF HARDWARE (Left)  Patient Location: PACU  Anesthesia Type: General  Level of Consciousness: awake, oriented, sedated and patient cooperative  Airway & Oxygen Therapy: Patient Spontanous Breathing and Patient connected to face mask oxygen  Post-op Assessment: Report given to PACU RN and Post -op Vital signs reviewed and stable  Post vital signs: Reviewed and stable  Complications: No apparent anesthesia complications  Last Vitals:  Vitals Value Taken Time  BP 133/82 09/10/24 12:40  Temp    Pulse 91 09/10/24 12:42  Resp 17 09/10/24 12:42  SpO2 98 % 09/10/24 12:42  Vitals shown include unfiled device data.  Last Pain:  Vitals:   09/10/24 1111  TempSrc: Temporal  PainSc: 0-No pain         Complications: No notable events documented.

## 2024-09-10 NOTE — Brief Op Note (Signed)
   Brief Op Note  Date of Surgery: 09/10/2024  Preoperative Diagnosis: LEFT ANKLE SYMPTOMATIC HARDWARE  Postoperative Diagnosis: same  Procedure: Procedure(s): LEFT ANKLE REMOVAL OF HARDWARE  Implants: Implant Name Type Inv. Item Serial No. Manufacturer Lot No. LRB No. Used Action  IMPLANT RECORD       1 Implanted    Surgeons: Surgeon(s): Genelle Standing, MD  Anesthesia: General    Estimated Blood Loss: See anesthesia record  Complications: None  Condition to PACU: Stable  Standing LITTIE Genelle, MD 09/10/2024 12:29 PM

## 2024-09-11 ENCOUNTER — Encounter (HOSPITAL_BASED_OUTPATIENT_CLINIC_OR_DEPARTMENT_OTHER): Payer: Self-pay | Admitting: Orthopaedic Surgery

## 2024-09-13 ENCOUNTER — Ambulatory Visit (HOSPITAL_BASED_OUTPATIENT_CLINIC_OR_DEPARTMENT_OTHER): Admitting: Physical Therapy

## 2024-09-20 ENCOUNTER — Ambulatory Visit (INDEPENDENT_AMBULATORY_CARE_PROVIDER_SITE_OTHER): Admitting: Orthopaedic Surgery

## 2024-09-20 ENCOUNTER — Encounter (HOSPITAL_BASED_OUTPATIENT_CLINIC_OR_DEPARTMENT_OTHER)

## 2024-09-20 DIAGNOSIS — M25572 Pain in left ankle and joints of left foot: Secondary | ICD-10-CM

## 2024-09-20 DIAGNOSIS — G8929 Other chronic pain: Secondary | ICD-10-CM

## 2024-09-20 NOTE — Progress Notes (Signed)
 "                                Post Operative Evaluation    Procedure/Date of Surgery: Left ankle removal of hardware 12/9  Interval History:    Presents today 2 weeks status post the above procedure.  Overall he is doing well.  He has been able to get back to running.   PMH/PSH/Family History/Social History/Meds/Allergies:    Past Medical History:  Diagnosis Date   Attention deficit hyperactivity disorder (ADHD)    Family history of early CAD    Hyperlipidemia    Hypertension    Left varicocele    Metabolic syndrome    Rotator cuff syndrome    Past Surgical History:  Procedure Laterality Date   APPENDECTOMY     COLONOSCOPY     HARDWARE REMOVAL Left 09/10/2024   Procedure: LEFT ANKLE REMOVAL OF HARDWARE;  Surgeon: Genelle Standing, MD;  Location: [facility];  Service: Orthopedics;  Laterality: Left;  LEFT ANKLE REMOVAL OF HARDWARE   ORIF ANKLE FRACTURE Left    Social History   Socioeconomic History   Marital status: Married    Spouse name: Not on file   Number of children: Not on file   Years of education: Not on file   Highest education level: Not on file  Occupational History   Not on file  Tobacco Use   Smoking status: Never   Smokeless tobacco: Never  Substance and Sexual Activity   Alcohol use: Yes    Comment: occasion   Drug use: No   Sexual activity: Yes  Other Topics Concern   Not on file  Social History Narrative   Not on file   Social Drivers of Health   Tobacco Use: Low Risk (09/10/2024)   Patient History    Smoking Tobacco Use: Never    Smokeless Tobacco Use: Never    Passive Exposure: Not on file  Financial Resource Strain: Not on file  Food Insecurity: No Food Insecurity (12/19/2023)   Hunger Vital Sign    Worried About Running Out of Food in the Last Year: Never true    Ran Out of Food in the Last Year: Never true  Transportation Needs: No Transportation Needs (12/19/2023)   PRAPARE - Scientist, Research (physical Sciences) (Medical): No    Lack of Transportation (Non-Medical): No  Physical Activity: Sufficiently Active (12/19/2023)   Exercise Vital Sign    Days of Exercise per Week: 4 days    Minutes of Exercise per Session: 50 min  Stress: Not on file  Social Connections: Not on file  Depression (EYV7-0): Not on file  Alcohol Screen: Not on file  Housing: Unknown (12/19/2023)   Housing Stability Vital Sign    Unable to Pay for Housing in the Last Year: No    Number of Times Moved in the Last Year: Not on file    Homeless in the Last Year: No  Utilities: Not At Risk (12/19/2023)   [employer] Utilities    Threatened with loss of utilities: No  Health Literacy: Not on file   Family History  Problem Relation Age of Onset   Heart attack Father    Coronary artery disease Father    Allergies[1] Current Outpatient Medications  Medication Sig Dispense Refill   amLODipine (NORVASC) 10 MG tablet Take 10 mg by mouth daily.     amphetamine-dextroamphetamine (ADDERALL) 15 MG tablet  Take 15 mg by mouth daily.     aspirin  EC 325 MG tablet Take 1 tablet (325 mg total) by mouth daily. 14 tablet 0   Evolocumab  (REPATHA  SURECLICK) 140 MG/ML SOAJ Inject 140 mg into the skin every 14 (fourteen) days. 6 mL 3   hydrochlorothiazide (HYDRODIURIL) 12.5 MG tablet Take 12.5 mg by mouth daily.     losartan (COZAAR) 100 MG tablet Take 100 mg by mouth daily. Takes 1 tab in the morning, 1/2 tab at night.     metoprolol succinate (TOPROL-XL) 25 MG 24 hr tablet Take 25 mg by mouth daily.     oxyCODONE  (ROXICODONE ) 5 MG immediate release tablet Take 1 tablet (5 mg total) by mouth every 4 (four) hours as needed for severe pain (pain score 7-10) or breakthrough pain. 10 tablet 0   rosuvastatin (CRESTOR) 40 MG tablet Take 40 mg by mouth daily.     zolpidem (AMBIEN) 10 MG tablet Take 10 mg by mouth at bedtime as needed for sleep.     No current facility-administered medications for this visit.   No results found.  Review of  Systems:   A ROS was performed including pertinent positives and negatives as documented in the HPI.   Musculoskeletal Exam:    There were no vitals taken for this visit.  Left ankle incisions are well-appearing without erythema or drainage.  Full active range of motion.  Mild swelling  Imaging:      I personally reviewed and interpreted the radiographs.   Assessment:   2 weeks status post left ankle removal of hardware doing well.  At this time he will progress his activity as tolerated I will plan to see him back in 4 weeks for reassessment  Plan :    - Return to clinic 4 weeks for reassessment      I personally saw and evaluated the patient, and participated in the management and treatment plan.  Elspeth Parker, MD Attending Physician, Orthopedic Surgery  This document was dictated using Dragon voice recognition software. A reasonable attempt at proof reading has been made to minimize errors.    [1] No Known Allergies  "

## 2024-10-25 ENCOUNTER — Other Ambulatory Visit (HOSPITAL_BASED_OUTPATIENT_CLINIC_OR_DEPARTMENT_OTHER): Payer: Self-pay

## 2024-10-25 ENCOUNTER — Ambulatory Visit (INDEPENDENT_AMBULATORY_CARE_PROVIDER_SITE_OTHER): Admitting: Orthopaedic Surgery

## 2024-10-25 DIAGNOSIS — G8929 Other chronic pain: Secondary | ICD-10-CM

## 2024-10-25 DIAGNOSIS — M25572 Pain in left ankle and joints of left foot: Secondary | ICD-10-CM

## 2024-10-25 MED ORDER — CEPHALEXIN 500 MG PO CAPS
500.0000 mg | ORAL_CAPSULE | Freq: Four times a day (QID) | ORAL | 0 refills | Status: AC
  Filled 2024-10-25: qty 12, 3d supply, fill #0

## 2024-10-25 NOTE — Addendum Note (Signed)
 Addended by: WOLFGANG CONLEY HERO on: 10/25/2024 01:08 PM   Modules accepted: Orders

## 2024-10-25 NOTE — Progress Notes (Signed)
 "                                Post Operative Evaluation    Procedure/Date of Surgery: Left ankle removal of hardware 12/9  Interval History:    Presents today 6 weeks status post the above procedure.  Overall he is doing well.  There has been some tenderness directly over the bone.  There has been some mild swelling as well   PMH/PSH/Family History/Social History/Meds/Allergies:    Past Medical History:  Diagnosis Date   Attention deficit hyperactivity disorder (ADHD)    Family history of early CAD    Hyperlipidemia    Hypertension    Left varicocele    Metabolic syndrome    Rotator cuff syndrome    Past Surgical History:  Procedure Laterality Date   APPENDECTOMY     COLONOSCOPY     HARDWARE REMOVAL Left 09/10/2024   Procedure: LEFT ANKLE REMOVAL OF HARDWARE;  Surgeon: Genelle Standing, MD;  Location: [facility];  Service: Orthopedics;  Laterality: Left;  LEFT ANKLE REMOVAL OF HARDWARE   ORIF ANKLE FRACTURE Left    Social History   Socioeconomic History   Marital status: Married    Spouse name: Not on file   Number of children: Not on file   Years of education: Not on file   Highest education level: Not on file  Occupational History   Not on file  Tobacco Use   Smoking status: Never   Smokeless tobacco: Never  Substance and Sexual Activity   Alcohol use: Yes    Comment: occasion   Drug use: No   Sexual activity: Yes  Other Topics Concern   Not on file  Social History Narrative   Not on file   Social Drivers of Health   Tobacco Use: Low Risk (09/10/2024)   Patient History    Smoking Tobacco Use: Never    Smokeless Tobacco Use: Never    Passive Exposure: Not on file  Financial Resource Strain: Not on file  Food Insecurity: No Food Insecurity (12/19/2023)   Hunger Vital Sign    Worried About Running Out of Food in the Last Year: Never true    Ran Out of Food in the Last Year: Never true  Transportation Needs: No Transportation Needs  (12/19/2023)   PRAPARE - Administrator, Civil Service (Medical): No    Lack of Transportation (Non-Medical): No  Physical Activity: Sufficiently Active (12/19/2023)   Exercise Vital Sign    Days of Exercise per Week: 4 days    Minutes of Exercise per Session: 50 min  Stress: Not on file  Social Connections: Not on file  Depression (EYV7-0): Not on file  Alcohol Screen: Not on file  Housing: Unknown (12/19/2023)   Housing Stability Vital Sign    Unable to Pay for Housing in the Last Year: No    Number of Times Moved in the Last Year: Not on file    Homeless in the Last Year: No  Utilities: Not At Risk (12/19/2023)   [employer] Utilities    Threatened with loss of utilities: No  Health Literacy: Not on file   Family History  Problem Relation Age of Onset   Heart attack Father    Coronary artery disease Father    Allergies[1] Current Outpatient Medications  Medication Sig Dispense Refill   cephALEXin (KEFLEX) 500 MG capsule Take 1 capsule (500 mg total)  by mouth 4 (four) times daily for 3 days. 12 capsule 0   amLODipine (NORVASC) 10 MG tablet Take 10 mg by mouth daily.     amphetamine-dextroamphetamine (ADDERALL) 15 MG tablet Take 15 mg by mouth daily.     aspirin  EC 325 MG tablet Take 1 tablet (325 mg total) by mouth daily. 14 tablet 0   Evolocumab  (REPATHA  SURECLICK) 140 MG/ML SOAJ Inject 140 mg into the skin every 14 (fourteen) days. 6 mL 3   hydrochlorothiazide (HYDRODIURIL) 12.5 MG tablet Take 12.5 mg by mouth daily.     losartan (COZAAR) 100 MG tablet Take 100 mg by mouth daily. Takes 1 tab in the morning, 1/2 tab at night.     metoprolol succinate (TOPROL-XL) 25 MG 24 hr tablet Take 25 mg by mouth daily.     oxyCODONE  (ROXICODONE ) 5 MG immediate release tablet Take 1 tablet (5 mg total) by mouth every 4 (four) hours as needed for severe pain (pain score 7-10) or breakthrough pain. 10 tablet 0   rosuvastatin (CRESTOR) 40 MG tablet Take 40 mg by mouth daily.     zolpidem  (AMBIEN) 10 MG tablet Take 10 mg by mouth at bedtime as needed for sleep.     No current facility-administered medications for this visit.   No results found.  Review of Systems:   A ROS was performed including pertinent positives and negatives as documented in the HPI.   Musculoskeletal Exam:    There were no vitals taken for this visit.  Left ankle incisions are well-appearing without erythema or drainage.  Full active range of motion.  Mild swelling  Imaging:      I personally reviewed and interpreted the radiographs.   Assessment:   6 weeks status post left ankle removal of hardware doing well.  Overall he is having some tenderness directly over the bone and some mild swelling.  I have plan to send him 3 days of antibiotics to be careful given the local swelling and redness.  I did also advise on a compression sleeve which she will purchase.  We also plan to get him engaged with physical therapy for 1-2 sessions.  Plan :    - Return to clinic 6 weeks for reassessment      I personally saw and evaluated the patient, and participated in the management and treatment plan.  Elspeth Parker, MD Attending Physician, Orthopedic Surgery  This document was dictated using Dragon voice recognition software. A reasonable attempt at proof reading has been made to minimize errors.     [1] No Known Allergies  "

## 2024-10-31 ENCOUNTER — Ambulatory Visit (HOSPITAL_BASED_OUTPATIENT_CLINIC_OR_DEPARTMENT_OTHER): Attending: Orthopaedic Surgery | Admitting: Physical Therapy

## 2024-10-31 ENCOUNTER — Other Ambulatory Visit: Payer: Self-pay

## 2024-10-31 ENCOUNTER — Encounter (HOSPITAL_BASED_OUTPATIENT_CLINIC_OR_DEPARTMENT_OTHER): Payer: Self-pay | Admitting: Physical Therapy

## 2024-10-31 DIAGNOSIS — R2689 Other abnormalities of gait and mobility: Secondary | ICD-10-CM | POA: Insufficient documentation

## 2024-10-31 DIAGNOSIS — G8929 Other chronic pain: Secondary | ICD-10-CM | POA: Diagnosis present

## 2024-10-31 DIAGNOSIS — S91002A Unspecified open wound, left ankle, initial encounter: Secondary | ICD-10-CM | POA: Insufficient documentation

## 2024-10-31 DIAGNOSIS — Y838 Other surgical procedures as the cause of abnormal reaction of the patient, or of later complication, without mention of misadventure at the time of the procedure: Secondary | ICD-10-CM | POA: Diagnosis not present

## 2024-10-31 DIAGNOSIS — M25572 Pain in left ankle and joints of left foot: Secondary | ICD-10-CM | POA: Insufficient documentation

## 2024-10-31 DIAGNOSIS — R29898 Other symptoms and signs involving the musculoskeletal system: Secondary | ICD-10-CM | POA: Diagnosis not present

## 2024-10-31 NOTE — Therapy (Signed)
 " OUTPATIENT PHYSICAL THERAPY LOWER EXTREMITY EVALUATION   Patient Name: Fernando Hernandez MRN: [id number] DOB:1965-12-03, 59 y.o., male Today's Date: 10/31/2024  END OF SESSION:  PT End of Session - 10/31/24 1016     Visit Number 1    Number of Visits 8    Date for Recertification  12/26/24    Authorization Type [employer]    PT Start Time 1015    PT Stop Time 1110    PT Time Calculation (min) 55 min    Activity Tolerance Patient tolerated treatment well    Behavior During Therapy WFL for tasks assessed/performed          Past Medical History:  Diagnosis Date   Attention deficit hyperactivity disorder (ADHD)    Family history of early CAD    Hyperlipidemia    Hypertension    Left varicocele    Metabolic syndrome    Rotator cuff syndrome    Past Surgical History:  Procedure Laterality Date   APPENDECTOMY     COLONOSCOPY     HARDWARE REMOVAL Left 09/10/2024   Procedure: LEFT ANKLE REMOVAL OF HARDWARE;  Surgeon: Genelle Standing, MD;  Location: [facility];  Service: Orthopedics;  Laterality: Left;  LEFT ANKLE REMOVAL OF HARDWARE   ORIF ANKLE FRACTURE Left    Patient Active Problem List   Diagnosis Date Noted   Retained orthopedic hardware 09/10/2024    PCP: Charlott Dorn LABOR, MD   REFERRING PROVIDER: Genelle Standing, MD  REFERRING DIAG: [phone] (ICD-10-CM) - Chronic pain of left ankle;  left ankle removal of hardware  THERAPY DIAG:  Pain in left ankle and joints of left foot  Other abnormalities of gait and mobility  Other symptoms and signs involving the musculoskeletal system  Open wound of left ankle, initial encounter  Rationale for Evaluation and Treatment: Rehabilitation  ONSET DATE:  DOS 09/10/24;  left ankle removal of hardware  SUBJECTIVE:   SUBJECTIVE STATEMENT: Patient states a couple of years ago he had a compound fracture. Took out hardware in December 2025. Was given antibiotic for possible infection. Works out 3-4  x/week. Does weights and cardio/ agility. Increased soreness today.   PERTINENT HISTORY: left ankle removal of hardware- symptomatic left ankle hardware after an initial injury 2 years ago where he had a fibula fixation and syndesmotic fixation for open ankle fracture   PAIN:  Are you having pain? Yes: NPRS scale: 6-7/10 Pain location: R ankle Pain description: numbness in foot, sharp at ankle Aggravating factors: touch Relieving factors: rest  PRECAUTIONS: None  WEIGHT BEARING RESTRICTIONS: No  FALLS:  Has patient fallen in last 6 months? No  OCCUPATION: [employer].   PLOF: Independent  PATIENT GOALS: decrease pain and swelling  OBJECTIVE: (objective measures from initial evaluation unless otherwise dated)  OBSERVATION:  devitalized tissue at L lateral ankle incision   PATIENT SURVEYS:  LEFS  Extreme difficulty/unable (0), Quite a bit of difficulty (1), Moderate difficulty (2), Little difficulty (3), No difficulty (4) Survey date:    Any of your usual work, housework or school activities   2. Usual hobbies, recreational or sporting activities   3. Getting into/out of the bath   4. Walking between rooms   5. Putting on socks/shoes   6. Squatting    7. Lifting an object, like a bag of groceries from the floor   8. Performing light activities around your home   9. Performing heavy activities around your home   10. Getting into/out of  a car   11. Walking 2 blocks   12. Walking 1 mile   13. Going up/down 10 stairs (1 flight)   14. Standing for 1 hour   15.  sitting for 1 hour   16. Running on even ground   17. Running on uneven ground   18. Making sharp turns while running fast   19. Hopping    20. Rolling over in bed   Score total:  Test next session - time limited     COGNITION: Overall cognitive status: Within functional limits for tasks assessed     SENSATION: WFL  EDEMA:   Gross edema around distal surgical wound.   POSTURE: No  Significant postural limitations  PALPATION: Grossly TTP L lateral malleolus/ periwound  LOWER EXTREMITY ROM:  Active ROM Right eval Left eval  Hip flexion    Hip extension    Hip abduction    Hip adduction    Hip internal rotation    Hip external rotation    Knee flexion    Knee extension    Ankle dorsiflexion 10 5  Ankle plantarflexion 58 48  Ankle inversion 32 25  Ankle eversion 12 8   (Blank rows = not tested) *= pain/symptoms  LOWER EXTREMITY MMT:  MMT Right eval Left eval  Hip flexion    Hip extension    Hip abduction    Hip adduction    Hip internal rotation    Hip external rotation    Knee flexion    Knee extension    Ankle dorsiflexion 5 5  Ankle plantarflexion    Ankle inversion 5 5  Ankle eversion 5 5   (Blank rows = not tested) *= pain/symptoms   FUNCTIONAL TESTS:    SLS: 15 seconds on LLE with mild sway Forward step down test: RLE valgus, LLE mild decreased ankle DF, mildly decreased eccentric strength and motor control  GAIT: Distance walked: 100 feet Assistive device utilized: None Level of assistance: Complete Independence Comments: mildly antalgic LLE   TODAY'S TREATMENT:                                                                                                                              DATE:  10/31/24 Eval, HEP, education,  dressing changes  Wound at L lateral malleolus at distal lateral incision,  after removal of scab - 25% slough, 75% granulation with devitalized tissue and epibole margins, mild drainage; approximately 1.5 cm x 1.2 cm; Patient agreed and consented to debridement of wound Debridement of slough, margins, and devitalized tissue at periwound with forceps and scissors with 100% granulation following Dressing applied:  xeroform, gauze, tegaderm  Discussion/education on wounds, wound healing, dressing types, proper at home care, follow up with MD if needed   PATIENT EDUCATION:  Education details: Patient  educated on exam findings, POC, scope of PT, HEP, relevant anatomy and biomechanics, and wound care. Person educated: Patient Education method: Explanation, Demonstration, and Handouts  Education comprehension: verbalized understanding, returned demonstration, verbal cues required, and tactile cues required  HOME EXERCISE PROGRAM: Access Code: [email or web address] URL: [email or web address] Date: 10/31/2024 Prepared by: Prentice Rhian Asebedo  Exercises - Theme Park Manager on [employer] (Mirrored)  - 2 x daily - 7 x weekly - 3 reps - 20 second hold - Single Leg Stance  - 2 x daily - 7 x weekly - 5 reps - 20-30 second hold  ASSESSMENT:  CLINICAL IMPRESSION: Patient a 59 y.o. y.o. male who was seen today for physical therapy evaluation and treatment for L ankle pain s/p  left ankle removal of hardware DOS 09/10/25. Patient presents with pain limited deficits in L ankle strength, ROM, endurance, activity tolerance, gait, balance, and functional mobility with ADL. Patient is having to modify and restrict ADL as indicated by outcome measure score as well as subjective information and objective measures which is affecting overall participation. Patient will benefit from skilled physical therapy in order to improve function and reduce impairment.  OBJECTIVE IMPAIRMENTS: Abnormal gait, decreased activity tolerance, decreased balance, decreased endurance, decreased mobility, difficulty walking, decreased ROM, decreased strength, increased muscle spasms, impaired flexibility, improper body mechanics, and pain  ACTIVITY LIMITATIONS: lifting, bending, standing, squatting, stairs, transfers, locomotion level, caring for others, and running  PARTICIPATION LIMITATIONS: meal prep, cleaning, laundry, shopping, community activity, occupation, and yard work  PERSONAL FACTORS: Time since onset of injury/illness/exacerbation and 1 comorbidity: hx left ankle injury are also affecting patient's functional outcome.   REHAB  POTENTIAL: Good  CLINICAL DECISION MAKING: Stable/uncomplicated  EVALUATION COMPLEXITY: Low   GOALS: Goals reviewed with patient? Yes  SHORT TERM GOALS: Target date: 11/28/2024    Patient will be independent with HEP in order to improve functional outcomes. Baseline: Goal status: INITIAL  2.  Patient will report at least 25% improvement in symptoms for improved quality of life. Baseline: Goal status: INITIAL    LONG TERM GOALS: Target date: 12/26/2024    Patient will report at least 75% improvement in symptoms for improved quality of life. Baseline:  Goal status: INITIAL  2.  Patient will improve LEFS score by at least 9 points in order to indicate improved tolerance to activity. Baseline:  Goal status: INITIAL  3.  Patient's left ankle incision/wound to be healed for decreased pain and to reduce risk of infection. Baseline:  Goal status: INITIAL  4.  Patient will be able to perform forward step down test without deviation in order to demonstrate improved LE strength and motor control.  Baseline:  Goal status: INITIAL  5.  Patient will be able to return to all activities unrestricted for improved ability to perform full workouts. Baseline:  Goal status: INITIAL     PLAN:  PT FREQUENCY: 1-2x/week  PT DURATION: 8 weeks  PLANNED INTERVENTIONS: 97164- PT Re-evaluation, 97110-Therapeutic exercises, 97530- Therapeutic activity, [id number]- Neuromuscular re-education, 97535- Self Care, 02859- Manual therapy, [id number]- Gait training, [phone]- Orthotic Fit/training, [phone]- Canalith repositioning, [id number]- Aquatic Therapy, [id number]- Splinting, [phone]- Wound care (first 20 sq cm), 97598- Wound care (each additional 20 sq cm)Patient/Family education, Balance training, Stair training, Taping, Dry Needling, Joint mobilization, Joint manipulation, Spinal manipulation, Spinal mobilization, Scar mobilization, and DME instructions.  PLAN FOR NEXT SESSION: assess L ankle wound healing,  debride/dressing change PRN. Ankle stability and motor control. Higher level functional strengthening ex. Step downs   Prentice GORMAN Stains, PT, DPT 10/31/2024, 11:29 AM  "

## 2024-11-06 ENCOUNTER — Encounter (HOSPITAL_BASED_OUTPATIENT_CLINIC_OR_DEPARTMENT_OTHER): Payer: Self-pay | Admitting: Physical Therapy

## 2024-11-06 ENCOUNTER — Ambulatory Visit (HOSPITAL_BASED_OUTPATIENT_CLINIC_OR_DEPARTMENT_OTHER): Admitting: Physical Therapy

## 2024-11-06 DIAGNOSIS — R29898 Other symptoms and signs involving the musculoskeletal system: Secondary | ICD-10-CM

## 2024-11-06 DIAGNOSIS — S91002A Unspecified open wound, left ankle, initial encounter: Secondary | ICD-10-CM

## 2024-11-06 DIAGNOSIS — M25572 Pain in left ankle and joints of left foot: Secondary | ICD-10-CM

## 2024-11-06 DIAGNOSIS — R2689 Other abnormalities of gait and mobility: Secondary | ICD-10-CM

## 2024-11-06 NOTE — Therapy (Signed)
 " OUTPATIENT PHYSICAL THERAPY TREATMENT   Patient Name: Fernando Hernandez MRN: [id number] DOB:14-Nov-1965, 59 y.o., male Today's Date: 11/06/2024  END OF SESSION:  PT End of Session - 11/06/24 1352     Visit Number 2    Number of Visits 8    Date for Recertification  12/26/24    Authorization Type Aetna    PT Start Time 1352    PT Stop Time 1440    PT Time Calculation (min) 48 min    Activity Tolerance Patient tolerated treatment well    Behavior During Therapy [facility] for tasks assessed/performed          Past Medical History:  Diagnosis Date   Attention deficit hyperactivity disorder (ADHD)    Family history of early CAD    Hyperlipidemia    Hypertension    Left varicocele    Metabolic syndrome    Rotator cuff syndrome    Past Surgical History:  Procedure Laterality Date   APPENDECTOMY     COLONOSCOPY     HARDWARE REMOVAL Left 09/10/2024   Procedure: LEFT ANKLE REMOVAL OF HARDWARE;  Surgeon: Genelle Standing, MD;  Location: [facility];  Service: Orthopedics;  Laterality: Left;  LEFT ANKLE REMOVAL OF HARDWARE   ORIF ANKLE FRACTURE Left    Patient Active Problem List   Diagnosis Date Noted   Retained orthopedic hardware 09/10/2024    PCP: Charlott Dorn LABOR, MD   REFERRING PROVIDER: Genelle Standing, MD  REFERRING DIAG: [id number] (ICD-10-CM) - Chronic pain of left ankle;  left ankle removal of hardware  THERAPY DIAG:  Pain in left ankle and joints of left foot  Other abnormalities of gait and mobility  Other symptoms and signs involving the musculoskeletal system  Open wound of left ankle, initial encounter  Rationale for Evaluation and Treatment: Rehabilitation  ONSET DATE:  DOS 09/10/24;  left ankle removal of hardware  SUBJECTIVE:   SUBJECTIVE STATEMENT: Patient states feeling better. Been changing dressings. Doing HEP.    EVAL: Patient states a couple of years ago he had a compound fracture. Took out hardware in December 2025.  Was given antibiotic for possible infection. Works out 3-4 x/week. Does weights and cardio/ agility. Increased soreness today.   PERTINENT HISTORY: left ankle removal of hardware- symptomatic left ankle hardware after an initial injury 2 years ago where he had a fibula fixation and syndesmotic fixation for open ankle fracture   PAIN:  Are you having pain? Yes: NPRS scale: 0/10 at rest; with touch 4/10 Pain location: R ankle Pain description: numbness in foot, sharp at ankle Aggravating factors: touch Relieving factors: rest  PRECAUTIONS: None  WEIGHT BEARING RESTRICTIONS: No  FALLS:  Has patient fallen in last 6 months? No  OCCUPATION: [employer].   PLOF: Independent  PATIENT GOALS: decrease pain and swelling  OBJECTIVE: (objective measures from initial evaluation unless otherwise dated)  OBSERVATION:  devitalized tissue at L lateral ankle incision   PATIENT SURVEYS:  LEFS  Extreme difficulty/unable (0), Quite a bit of difficulty (1), Moderate difficulty (2), Little difficulty (3), No difficulty (4) Survey date:  11/06/24  Any of your usual work, housework or school activities 4  2. Usual hobbies, recreational or sporting activities 3  3. Getting into/out of the bath 4  4. Walking between rooms 4  5. Putting on socks/shoes 4  6. Squatting  3  7. Lifting an object, like a bag of groceries from the floor 4  8. Performing light activities around your  home 4  9. Performing heavy activities around your home 3  10. Getting into/out of a car 4  11. Walking 2 blocks 4  12. Walking 1 mile 4  13. Going up/down 10 stairs (1 flight) 4  14. Standing for 1 hour 2  15.  sitting for 1 hour 4  16. Running on even ground 3  17. Running on uneven ground 3  18. Making sharp turns while running fast 3  19. Hopping  3  20. Rolling over in bed 3  Score total:  70/80     COGNITION: Overall cognitive status: Within functional limits for tasks  assessed     SENSATION: WFL  EDEMA:   Gross edema around distal surgical wound.   POSTURE: No Significant postural limitations  PALPATION: Grossly TTP L lateral malleolus/ periwound  LOWER EXTREMITY ROM:  Active ROM Right eval Left eval Left 11/06/24  Hip flexion     Hip extension     Hip abduction     Hip adduction     Hip internal rotation     Hip external rotation     Knee flexion     Knee extension     Ankle dorsiflexion 10 5 5  improves to 8   Ankle plantarflexion 58 48   Ankle inversion 32 25   Ankle eversion 12 8    (Blank rows = not tested) *= pain/symptoms  LOWER EXTREMITY MMT:  MMT Right eval Left eval  Hip flexion    Hip extension    Hip abduction    Hip adduction    Hip internal rotation    Hip external rotation    Knee flexion    Knee extension    Ankle dorsiflexion 5 5  Ankle plantarflexion    Ankle inversion 5 5  Ankle eversion 5 5   (Blank rows = not tested) *= pain/symptoms   FUNCTIONAL TESTS:    SLS: 15 seconds on LLE with mild sway Forward step down test: RLE valgus, LLE mild decreased ankle DF, mildly decreased eccentric strength and motor control  GAIT: Distance walked: 100 feet Assistive device utilized: None Level of assistance: Complete Independence Comments: mildly antalgic LLE   TODAY'S TREATMENT:                                                                                                                              DATE:  11/06/24 Wound 0.2x 0.1cm with slough, cleansed wound and it is 100% granulated following Dressing applied:  xeroform, gauze, tegaderm  Educated on self management of wound Scar mobilizations to superior regions- educated on self manual Manual: Grade III talocrual AP glide with DF overpressure  SL HR on 1/2 foam roll with eccentric control 2x 10 Forward step down 2 x 10   10/31/24 Eval, HEP, education,  dressing changes  Wound at L lateral malleolus at distal lateral incision,  after removal of  scab - 25% slough, 75% granulation with devitalized  tissue and epibole margins, mild drainage; approximately 1.5 cm x 1.2 cm; Patient agreed and consented to debridement of wound Debridement of slough, margins, and devitalized tissue at periwound with forceps and scissors with 100% granulation following Dressing applied:  xeroform, gauze, tegaderm  Discussion/education on wounds, wound healing, dressing types, proper at home care, follow up with MD if needed   PATIENT EDUCATION:  Education details: Patient educated on exam findings, POC, scope of PT, HEP, relevant anatomy and biomechanics, and wound care. Person educated: Patient Education method: Explanation, Demonstration, and Handouts Education comprehension: verbalized understanding, returned demonstration, verbal cues required, and tactile cues required  HOME EXERCISE PROGRAM: Access Code: [email or web address] URL: [email or web address] Date: 10/31/2024 Prepared by: Prentice Rexton Greulich  Exercises - Theme Park Manager on [employer] (Mirrored)  - 2 x daily - 7 x weekly - 3 reps - 20 second hold - Single Leg Stance  - 2 x daily - 7 x weekly - 5 reps - 20-30 second hold  ASSESSMENT:  CLINICAL IMPRESSION: Patient will wound healing very well and will likely be healed in next few days. Very small spot left and decreasing erythema and irration at lateral malleolus. Manual with improvement in tissue mobility at scar and improvement in DF ROM with manual. Continued with ankle stability and LE strengthening exercises. Educated on self manual,, dressing changes, POC. Cueing for proper mechanics and control with good carry over. Patient will continue to benefit from physical therapy in order to improve function and reduce impairment.   OBJECTIVE IMPAIRMENTS: Abnormal gait, decreased activity tolerance, decreased balance, decreased endurance, decreased mobility, difficulty walking, decreased ROM, decreased strength, increased muscle spasms, impaired  flexibility, improper body mechanics, and pain  ACTIVITY LIMITATIONS: lifting, bending, standing, squatting, stairs, transfers, locomotion level, caring for others, and running  PARTICIPATION LIMITATIONS: meal prep, cleaning, laundry, shopping, community activity, occupation, and yard work  PERSONAL FACTORS: Time since onset of injury/illness/exacerbation and 1 comorbidity: hx left ankle injury are also affecting patient's functional outcome.   REHAB POTENTIAL: Good  CLINICAL DECISION MAKING: Stable/uncomplicated  EVALUATION COMPLEXITY: Low   GOALS: Goals reviewed with patient? Yes  SHORT TERM GOALS: Target date: 11/28/2024    Patient will be independent with HEP in order to improve functional outcomes. Baseline: Goal status: INITIAL  2.  Patient will report at least 25% improvement in symptoms for improved quality of life. Baseline: Goal status: INITIAL    LONG TERM GOALS: Target date: 12/26/2024    Patient will report at least 75% improvement in symptoms for improved quality of life. Baseline:  Goal status: INITIAL  2.  Patient will improve LEFS score by at least 9 points in order to indicate improved tolerance to activity. Baseline:  Goal status: INITIAL  3.  Patient's left ankle incision/wound to be healed for decreased pain and to reduce risk of infection. Baseline:  Goal status: INITIAL  4.  Patient will be able to perform forward step down test without deviation in order to demonstrate improved LE strength and motor control.  Baseline:  Goal status: INITIAL  5.  Patient will be able to return to all activities unrestricted for improved ability to perform full workouts. Baseline:  Goal status: INITIAL     PLAN:  PT FREQUENCY: 1-2x/week  PT DURATION: 8 weeks  PLANNED INTERVENTIONS: 97164- PT Re-evaluation, 97110-Therapeutic exercises, 97530- Therapeutic activity, [id number]- Neuromuscular re-education, 97535- Self Care, 02859- Manual therapy, [id number]- Gait  training, [id number]- Orthotic Fit/training, [id number]- Canalith repositioning, [id number]- Aquatic Therapy, [id number]- Splinting, [id number]- Wound care (first 20 sq cm), 02401-  Wound care (each additional 20 sq cm)Patient/Family education, Balance training, Stair training, Taping, Dry Needling, Joint mobilization, Joint manipulation, Spinal manipulation, Spinal mobilization, Scar mobilization, and DME instructions.  PLAN FOR NEXT SESSION: assess L ankle wound healing, debride/dressing change PRN. Ankle stability and motor control. Higher level functional strengthening ex. Step downs   Prentice GORMAN Stains, PT, DPT 11/06/2024, 2:51 PM  "

## 2024-12-02 ENCOUNTER — Encounter (HOSPITAL_BASED_OUTPATIENT_CLINIC_OR_DEPARTMENT_OTHER): Admitting: Physical Therapy

## 2024-12-06 ENCOUNTER — Encounter (HOSPITAL_BASED_OUTPATIENT_CLINIC_OR_DEPARTMENT_OTHER): Admitting: Orthopaedic Surgery

## 2024-12-09 ENCOUNTER — Encounter (HOSPITAL_BASED_OUTPATIENT_CLINIC_OR_DEPARTMENT_OTHER): Admitting: Physical Therapy

## 2024-12-16 ENCOUNTER — Encounter (HOSPITAL_BASED_OUTPATIENT_CLINIC_OR_DEPARTMENT_OTHER): Admitting: Physical Therapy

## 2024-12-23 ENCOUNTER — Encounter (HOSPITAL_BASED_OUTPATIENT_CLINIC_OR_DEPARTMENT_OTHER): Admitting: Physical Therapy
